# Patient Record
Sex: Female | Born: 1955 | Race: White | Hispanic: No | Marital: Married | Smoking: Never smoker
Health system: Southern US, Community
[De-identification: ages and names within clinical notes are randomized; demographics above are authoritative.]

## PROBLEM LIST (undated history)

## (undated) DIAGNOSIS — I10 Essential (primary) hypertension: Secondary | ICD-10-CM

## (undated) DIAGNOSIS — N814 Uterovaginal prolapse, unspecified: Secondary | ICD-10-CM

## (undated) DIAGNOSIS — K219 Gastro-esophageal reflux disease without esophagitis: Secondary | ICD-10-CM

## (undated) DIAGNOSIS — I825Z1 Chronic embolism and thrombosis of unspecified deep veins of right distal lower extremity: Principal | ICD-10-CM

## (undated) DIAGNOSIS — L509 Urticaria, unspecified: Secondary | ICD-10-CM

## (undated) DIAGNOSIS — F419 Anxiety disorder, unspecified: Secondary | ICD-10-CM

## (undated) DIAGNOSIS — F329 Major depressive disorder, single episode, unspecified: Secondary | ICD-10-CM

## (undated) DIAGNOSIS — D689 Coagulation defect, unspecified: Secondary | ICD-10-CM

## (undated) DIAGNOSIS — Z9889 Other specified postprocedural states: Secondary | ICD-10-CM

## (undated) DIAGNOSIS — D6851 Activated protein C resistance: Secondary | ICD-10-CM

## (undated) DIAGNOSIS — F32A Depression, unspecified: Secondary | ICD-10-CM

## (undated) DIAGNOSIS — R112 Nausea with vomiting, unspecified: Secondary | ICD-10-CM

## (undated) HISTORY — DX: Other specified postprocedural states: Z98.890

## (undated) HISTORY — DX: Depression, unspecified: F32.A

## (undated) HISTORY — DX: Essential (primary) hypertension: I10

## (undated) HISTORY — DX: Urticaria, unspecified: L50.9

## (undated) HISTORY — PX: OTHER SURGICAL HISTORY: SHX169

## (undated) HISTORY — DX: Chronic embolism and thrombosis of unspecified deep veins of right distal lower extremity: I82.5Z1

## (undated) HISTORY — DX: Major depressive disorder, single episode, unspecified: F32.9

## (undated) HISTORY — DX: Gastro-esophageal reflux disease without esophagitis: K21.9

## (undated) HISTORY — PX: HERNIA REPAIR: SHX51

## (undated) HISTORY — PX: VAGINAL HYSTERECTOMY: SUR661

## (undated) HISTORY — DX: Nausea with vomiting, unspecified: R11.2

## (undated) HISTORY — DX: Anxiety disorder, unspecified: F41.9

## (undated) HISTORY — DX: Uterovaginal prolapse, unspecified: N81.4

## (undated) HISTORY — DX: Activated protein C resistance: D68.51

## (undated) HISTORY — DX: Coagulation defect, unspecified: D68.9

## (undated) HISTORY — PX: COLONOSCOPY: SHX174

---

## 1999-03-27 ENCOUNTER — Other Ambulatory Visit: Admission: RE | Admit: 1999-03-27 | Discharge: 1999-03-27 | Payer: Self-pay | Admitting: *Deleted

## 2000-12-29 ENCOUNTER — Other Ambulatory Visit: Admission: RE | Admit: 2000-12-29 | Discharge: 2000-12-29 | Payer: Self-pay | Admitting: *Deleted

## 2002-04-19 ENCOUNTER — Other Ambulatory Visit: Admission: RE | Admit: 2002-04-19 | Discharge: 2002-04-19 | Payer: Self-pay | Admitting: *Deleted

## 2003-08-13 ENCOUNTER — Other Ambulatory Visit: Admission: RE | Admit: 2003-08-13 | Discharge: 2003-08-13 | Payer: Self-pay | Admitting: *Deleted

## 2004-10-21 ENCOUNTER — Other Ambulatory Visit: Admission: RE | Admit: 2004-10-21 | Discharge: 2004-10-21 | Payer: Self-pay | Admitting: *Deleted

## 2005-11-16 ENCOUNTER — Other Ambulatory Visit: Admission: RE | Admit: 2005-11-16 | Discharge: 2005-11-16 | Payer: Self-pay | Admitting: *Deleted

## 2006-10-26 ENCOUNTER — Other Ambulatory Visit: Admission: RE | Admit: 2006-10-26 | Discharge: 2006-10-26 | Payer: Self-pay | Admitting: *Deleted

## 2011-09-22 ENCOUNTER — Other Ambulatory Visit: Payer: Self-pay | Admitting: Gynecology

## 2011-09-22 DIAGNOSIS — R928 Other abnormal and inconclusive findings on diagnostic imaging of breast: Secondary | ICD-10-CM

## 2011-10-04 ENCOUNTER — Other Ambulatory Visit: Payer: Self-pay

## 2011-10-04 ENCOUNTER — Ambulatory Visit
Admission: RE | Admit: 2011-10-04 | Discharge: 2011-10-04 | Disposition: A | Discharge status: Home / Self Care | Payer: BC Managed Care – PPO | Source: Ambulatory Visit | Attending: Gynecology | Admitting: Gynecology

## 2011-10-04 ENCOUNTER — Other Ambulatory Visit: Payer: Self-pay | Admitting: Gynecology

## 2011-10-04 DIAGNOSIS — R928 Other abnormal and inconclusive findings on diagnostic imaging of breast: Secondary | ICD-10-CM

## 2012-09-19 ENCOUNTER — Other Ambulatory Visit: Payer: Self-pay | Admitting: Internal Medicine

## 2012-09-19 ENCOUNTER — Ambulatory Visit
Admission: RE | Admit: 2012-09-19 | Discharge: 2012-09-19 | Disposition: A | Discharge status: Home / Self Care | Payer: BC Managed Care – PPO | Source: Ambulatory Visit | Attending: Internal Medicine | Admitting: Internal Medicine

## 2012-09-19 DIAGNOSIS — M79661 Pain in right lower leg: Secondary | ICD-10-CM

## 2012-12-08 ENCOUNTER — Encounter (INDEPENDENT_AMBULATORY_CARE_PROVIDER_SITE_OTHER): Payer: BC Managed Care – PPO

## 2012-12-08 DIAGNOSIS — M79609 Pain in unspecified limb: Secondary | ICD-10-CM

## 2012-12-12 ENCOUNTER — Telehealth: Payer: Self-pay | Admitting: Cardiology

## 2012-12-12 NOTE — Telephone Encounter (Signed)
Lower Venous Report Faxed to Dr. Eulah Pont

## 2014-06-03 IMAGING — US US EXTREM LOW VENOUS*R*
1 series · 14 of 24 positions shown · non-contrast
Comparison: None.

CLINICAL DATA: Right calf pain and swelling.

RIGHT LOWER EXTREMITY VENOUS DUPLEX ULTRASOUND
TECHNIQUE: Gray-scale sonography with graded compression, as well
as color Doppler and duplex ultrasound, were performed to evaluate
the deep venous system of the lower extremity from the level of the
common femoral vein through the popliteal and proximal calf veins.
Spectral Doppler was utilized to evaluate flow at rest and with
distal augmentation maneuvers.

[Series 1: us extrem low venous*right* · 14 of 31 slices shown]
[im 1/31]
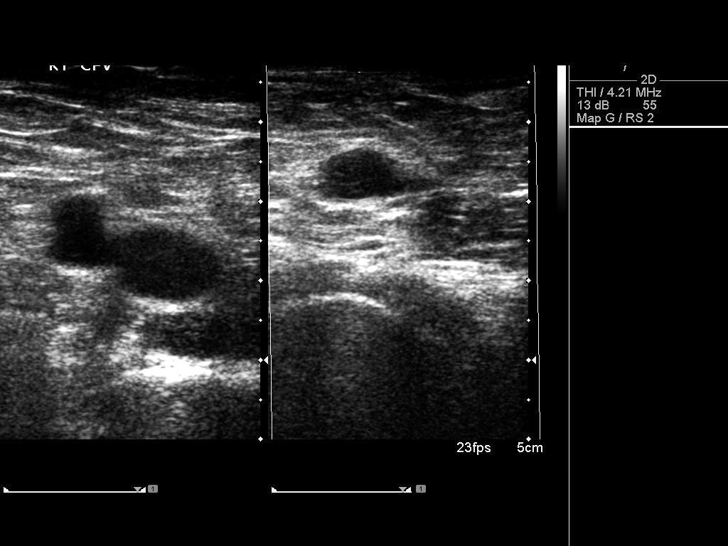
[im 3/31]
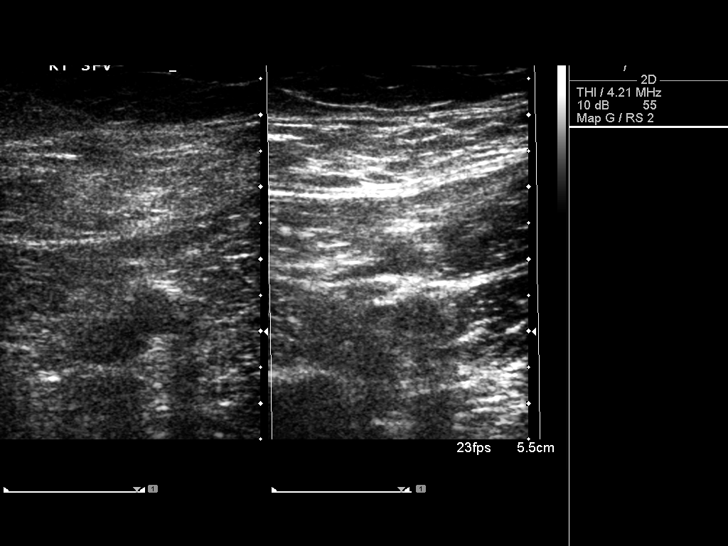
[im 6/31]
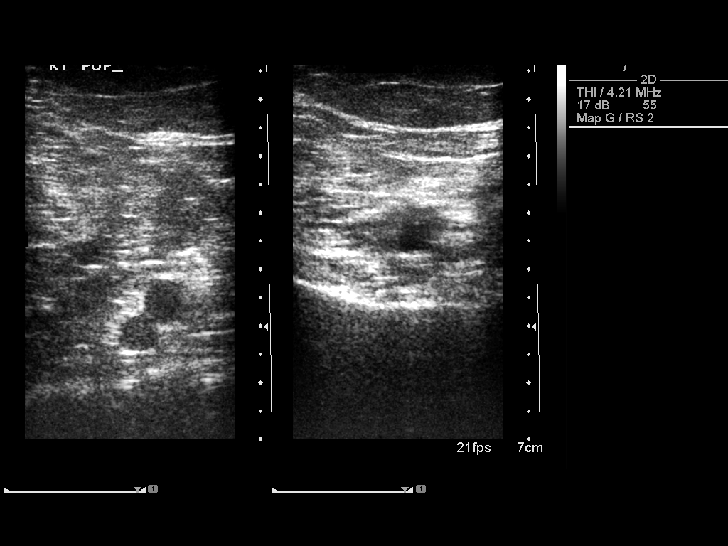
[im 8/31]
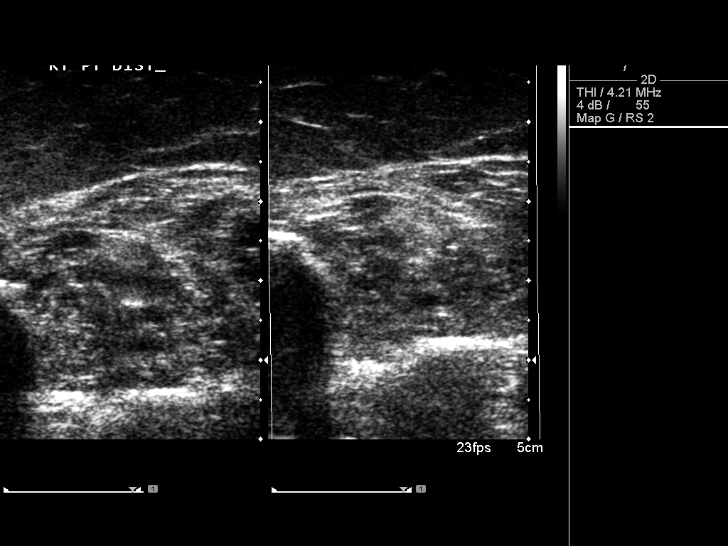
[im 10/31]
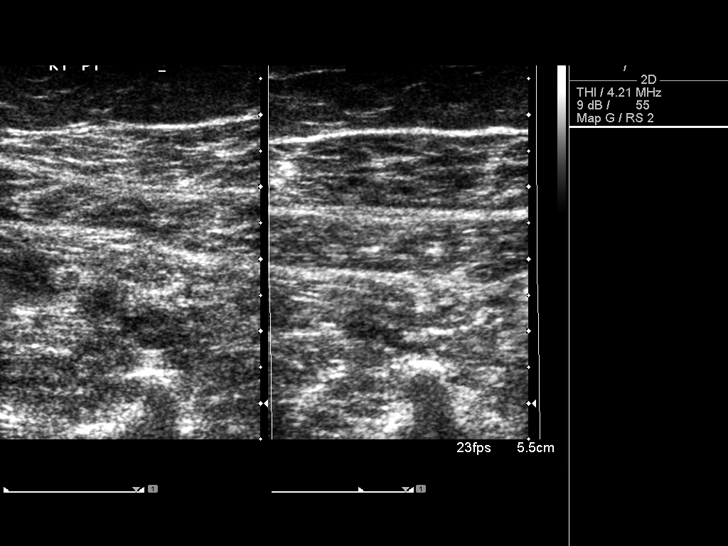
[im 12/31]
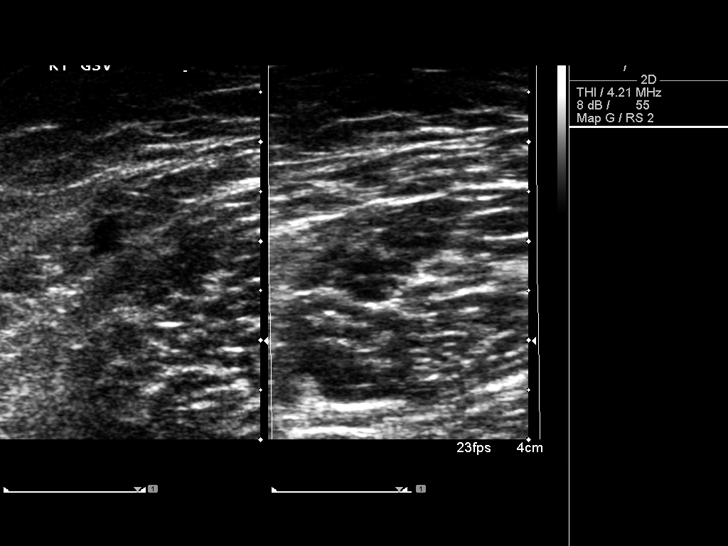
[im 15/31]
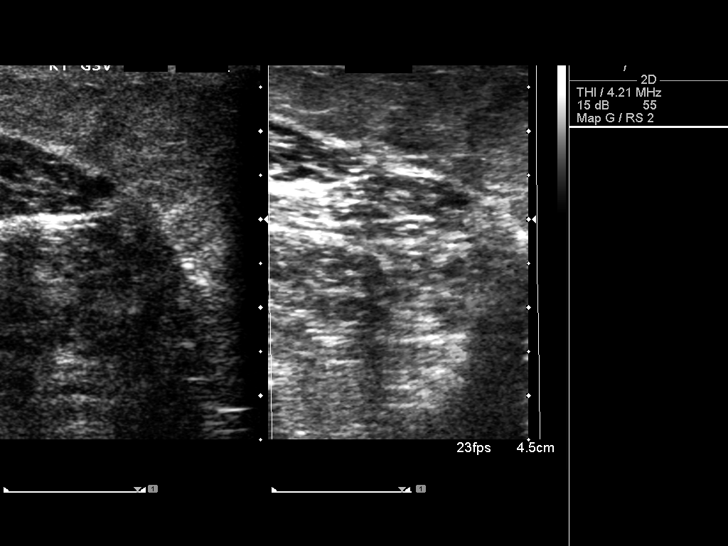
[im 16/31]
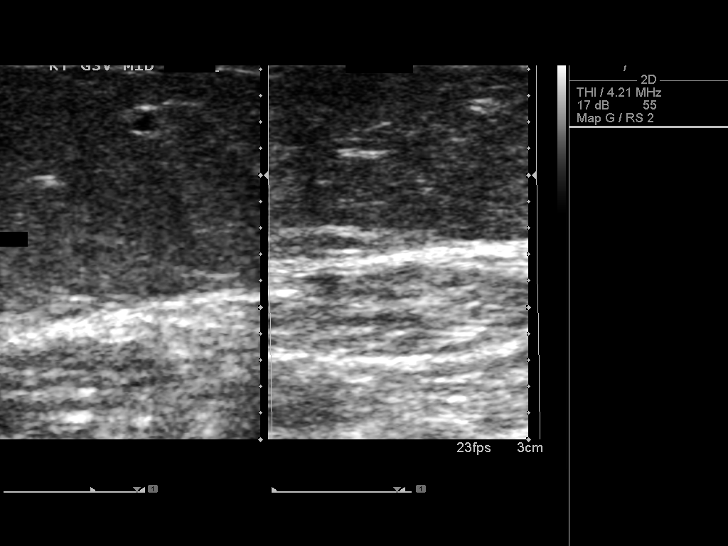
[im 19/31]
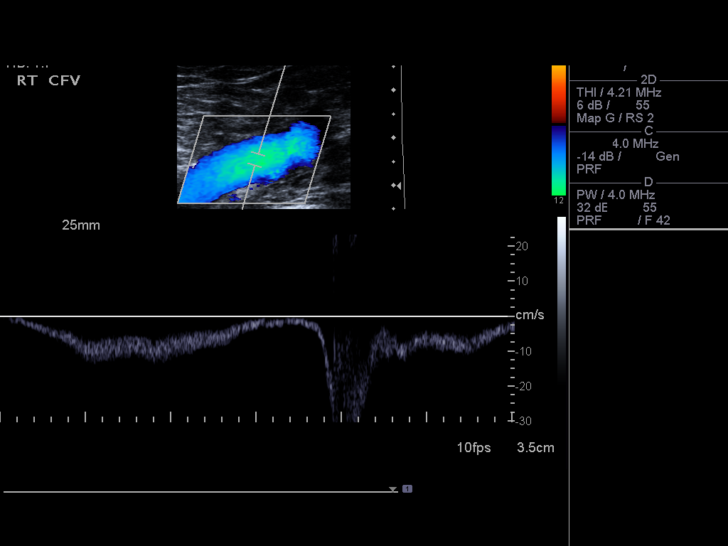
[im 21/31]
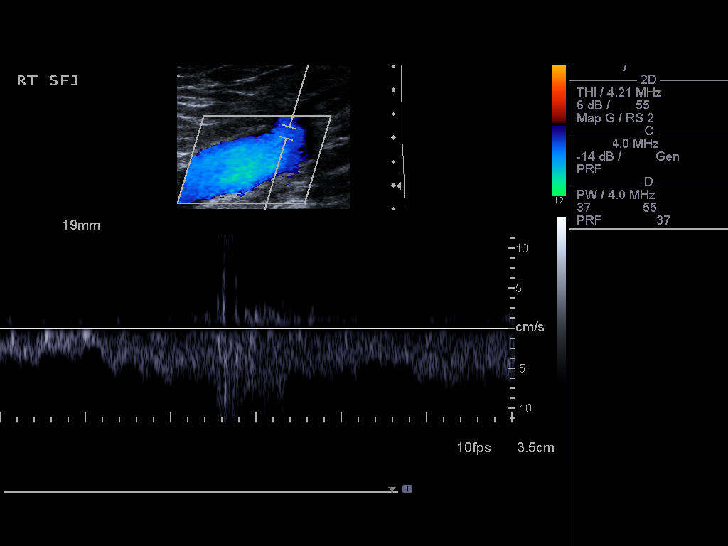
[im 24/31]
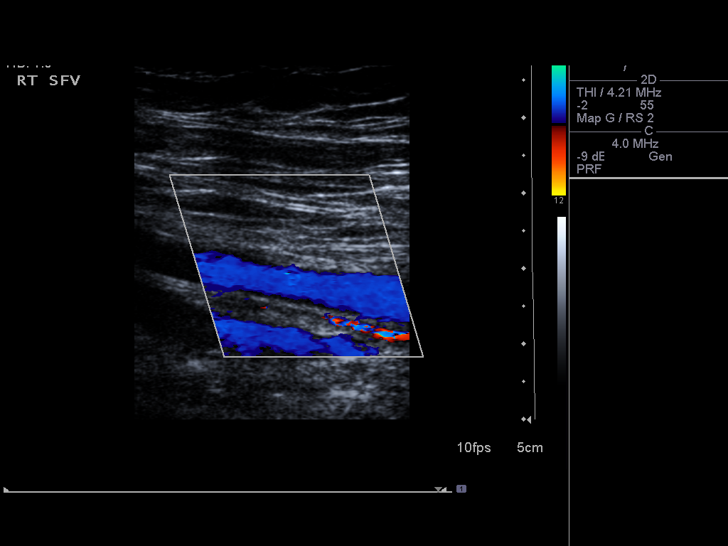
[im 25/31]
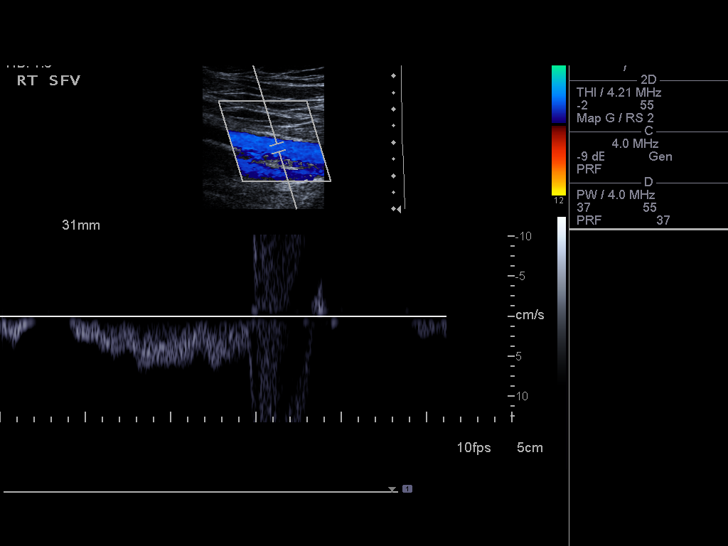
[im 28/31]
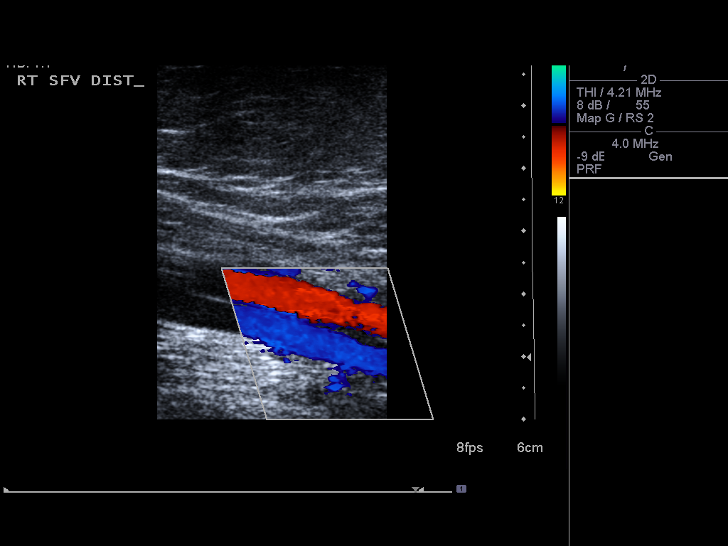
[im 31/31]
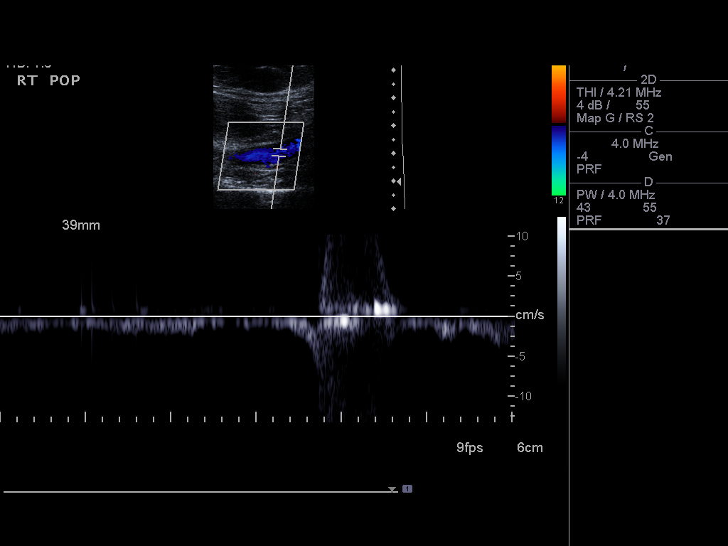

[14 of 24 positions shown; findings below may reference images not displayed]

FINDINGS: From the level of the right common femoral vein to the
right popliteal vein, there is adequate color flow, compression and
augmentation without evidence of a right lower extremity deep
venous thrombosis.

Evaluation of the right calf veins is limited.

Interrogated aspects of the right greater saphenous vein appear
patent.
IMPRESSION: No evidence of right lower extremity deep venous thrombosis as
discussed above.

## 2015-12-18 ENCOUNTER — Ambulatory Visit (INDEPENDENT_AMBULATORY_CARE_PROVIDER_SITE_OTHER): Payer: 59 | Admitting: Sports Medicine

## 2015-12-18 ENCOUNTER — Ambulatory Visit: Payer: 59

## 2015-12-18 ENCOUNTER — Encounter: Payer: Self-pay | Admitting: Sports Medicine

## 2015-12-18 DIAGNOSIS — S92912A Unspecified fracture of left toe(s), initial encounter for closed fracture: Secondary | ICD-10-CM

## 2015-12-18 DIAGNOSIS — M79672 Pain in left foot: Secondary | ICD-10-CM

## 2015-12-18 NOTE — Progress Notes (Signed)
Patient ID: Laura HensenAngela Le, female   DOB: Jul 04, 1956, 60 y.o.   MRN: [id number] Subjective: Laura Le is a 60 y.o. female patient who presents to office for evaluation of Left foot pain. Patient complains of pain especially over the last 2 weeks after jamming her foot. Patient has tried splinting with no relief in symptoms. Patient denies any other pedal complaints.   There are no active problems to display for this patient.   No current outpatient prescriptions on file prior to visit.   No current facility-administered medications on file prior to visit.    Not on File  Objective:  General: Alert and oriented x3 in no acute distress  Dermatology: No open lesions bilateral lower extremities, no webspace macerations, no ecchymosis bilateral, all nails x 10 are well manicured.  Vascular: Focal edema noted to left 4th toe. Dorsalis Pedis and Posterior Tibial pedal pulses 2/4, Capillary Fill Time 3 seconds,(+) pedal hair growth bilateral, Temperature gradient within normal limits.  Neurology: Gross sensation intact via light touch bilateral, (- )Tinels sign left foot.   Musculoskeletal: There is tenderness with palpation at 4th toe on Left foot, ,No pain with calf compression bilateral. All joint range of motion is within normal limits except at left 4th toe where there is guarding, Strength within normal limits in all groups bilateral.   Xrays  Left Foot   Impression:Normal osseous mineralization. There is fracture, proximal phalanx, fourth toe that is minimally displaced extra-articular non-comminuted with a small step-off portion. No dislocation, mild soft tissue swelling focal to fourth toe, mild lesser hammertoe deformity. Inferior calcaneal spur. Mild midfoot and ankle arthritic changes. No other acute findings.    Assessment and Plan: Problem List Items Addressed This Visit    None    Visit Diagnoses    Left foot pain    -  Primary    Relevant Orders    DG Foot Complete Left    Toe fracture, left, closed, initial encounter        4th toe proximal phalanx       -Complete examination performed -Xrays reviewed -Discussed treatement options for fracture; risks, alternatives, and benefits explained. -Applied Toe splint and instructed patient to do same daily -Dispensed post op shoe to patient to wear at all times and instructed on use -Recommend protection, rest, ice, elevation daily until symptoms improve -Recommend Tylenol as needed for pain -Patient to return to office in 4 weeks for serial x-rays to assess healing  or sooner if condition worsens.  Asencion Islamitorya Esgar Barnick, DPM

## 2015-12-18 NOTE — Patient Instructions (Signed)
Toe Fracture °A toe fracture is a break in one of the toe bones (phalanges). °CAUSES °This condition may be caused by: °· Dropping a heavy object on your toe. °· Stubbing your toe. °· Overusing your toe or doing repetitive exercise. °· Twisting or stretching your toe out of place. °RISK FACTORS °This condition is more likely to develop in people who: °· Play contact sports. °· Have a bone disease. °· Have a low calcium level. °SYMPTOMS °The main symptoms of this condition are swelling and pain in the toe. The pain may get worse with standing or walking. Other symptoms include: °· Bruising. °· Stiffness. °· Numbness. °· A change in the way the toe looks. °· Broken bones that poke through the skin. °· Blood beneath the toenail. °DIAGNOSIS °This condition is diagnosed with a physical exam. You may also have X-rays. °TREATMENT  °Treatment for this condition depends on the type of fracture and its severity. Treatment may involve: °· Taping the broken toe to a toe that is next to it (buddy taping). This is the most common treatment for fractures in which the bone has not moved out of place (nondisplaced fracture). °· Wearing a shoe that has a wide, rigid sole to protect the toe and to limit its movement. °· Wearing a walking cast. °· Having a procedure to move the toe back into place. °· Surgery. This may be needed: °¨ If there are many pieces of broken bone that are out of place (displaced). °¨ If the toe joint breaks. °¨ If the bone breaks through the skin. °· Physical therapy. This is done to help regain movement and strength in the toe. °You may need follow-up X-rays to make sure that the bone is healing well and staying in position. °HOME CARE INSTRUCTIONS °If You Have a Cast: °· Do not stick anything inside the cast to scratch your skin. Doing that increases your risk of infection. °· Check the skin around the cast every day. Report any concerns to your health care provider. You may put lotion on dry skin around the  edges of the cast. Do not apply lotion to the skin underneath the cast. °· Do not put pressure on any part of the cast until it is fully hardened. This may take several hours. °· Keep the cast clean and dry. °Bathing °· Do not take baths, swim, or use a hot tub until your health care provider approves. Ask your health care provider if you can take showers. You may only be allowed to take sponge baths for bathing. °· If your health care provider approves bathing and showering, cover the cast or bandage (dressing) with a watertight plastic bag to protect it from water. Do not let the cast or dressing get wet. °Managing Pain, Stiffness, and Swelling °· If you do not have a cast, apply ice to the injured area, if directed. °¨ Put ice in a plastic bag. °¨ Place a towel between your skin and the bag. °¨ Leave the ice on for 20 minutes, 2-3 times per day. °· Move your toes often to avoid stiffness and to lessen swelling. °· Raise (elevate) the injured area above the level of your heart while you are sitting or lying down. °Driving °· Do not drive or operate heavy machinery while taking pain medicine. °· Do not drive while wearing a cast on a foot that you use for driving. °Activity °· Return to your normal activities as directed by your health care provider. Ask your health care   provider what activities are safe for you. °· Perform exercises daily as directed by your health care provider or physical therapist. °Safety °· Do not use the injured limb to support your body weight until your health care provider says that you can. Use crutches or other assistive devices as directed by your health care provider. °General Instructions °· If your toe was treated with buddy taping, follow your health care provider's instructions for changing the gauze and tape. Change it more often: °¨ The gauze and tape get wet. If this happens, dry the space between the toes. °¨ The gauze and tape are too tight and cause your toe to become pale  or numb. °· Wear a protective shoe as directed by your health care provider. If you were not given a protective shoe, wear sturdy, supportive shoes. Your shoes should not pinch your toes and should not fit tightly against your toes. °· Do not use any tobacco products, including cigarettes, chewing tobacco, or e-cigarettes. Tobacco can delay bone healing. If you need help quitting, ask your health care provider. °· Take medicines only as directed by your health care provider. °· Keep all follow-up visits as directed by your health care provider. This is important. °SEEK MEDICAL CARE IF: °· You have a fever. °· Your pain medicine is not helping. °· Your toe is cold. °· Your toe is numb. °· You still have pain after one week of rest and treatment. °· You still have pain after your health care provider has said that you can start walking again. °· You have pain, tingling, or numbness in your foot that is not going away. °SEEK IMMEDIATE MEDICAL CARE IF: °· You have severe pain. °· You have redness or inflammation in your toe that is getting worse. °· You have pain or numbness in your toe that is getting worse. °· Your toe turns blue. °  °This information is not intended to replace advice given to you by your health care provider. Make sure you discuss any questions you have with your health care provider. °  °Document Released: 07/23/2000 Document Revised: 04/16/2015 Document Reviewed: 05/22/2014 °Elsevier Interactive Patient Education ©2016 [employer] ° °

## 2016-01-15 ENCOUNTER — Encounter: Payer: Self-pay | Admitting: Sports Medicine

## 2016-01-15 ENCOUNTER — Ambulatory Visit (INDEPENDENT_AMBULATORY_CARE_PROVIDER_SITE_OTHER): Payer: 59

## 2016-01-15 ENCOUNTER — Ambulatory Visit (INDEPENDENT_AMBULATORY_CARE_PROVIDER_SITE_OTHER): Payer: 59 | Admitting: Sports Medicine

## 2016-01-15 DIAGNOSIS — M79672 Pain in left foot: Secondary | ICD-10-CM

## 2016-01-15 DIAGNOSIS — S92912D Unspecified fracture of left toe(s), subsequent encounter for fracture with routine healing: Secondary | ICD-10-CM | POA: Diagnosis not present

## 2016-01-15 NOTE — Progress Notes (Signed)
Patient ID: Laura HensenAngela Le, female   DOB: [birth year], 60 y.o.   MRN: [id number]   Subjective: Laura Hensenngela Le is a 60 y.o. female patient who returns to office for follow up evaluation of Left foot pain secondary to fracture. Patient states that pain is better; has been wearing post op shoe and coban splint with improvement. Patient denies any other pedal complaints.   Patient Active Problem List   Diagnosis Date Noted  . 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 buPROPion (WELLBUTRIN XL) 150 MG 24 hr tablet TAKE 1 TABLET BY MOUTH DAILY--NEEDS APPT FOR MORE REFILLS  0  . ELIQUIS 5 MG TABS tablet Take 5 mg by mouth 2 (two) times daily.  5  . valsartan (DIOVAN) 80 MG tablet Take 80 mg by mouth daily.  4   No current facility-administered medications on file prior to visit.    No Known Allergies  Objective:  General: Alert and oriented x3 in no acute distress  Dermatology: No open lesions bilateral lower extremities, no webspace macerations, no ecchymosis bilateral, all nails x 10 are well manicured.  Vascular: Decreased edema noted to left 4th toe. Dorsalis Pedis and Posterior Tibial pedal pulses 2/4, Capillary Fill Time 3 seconds,(+) pedal hair growth bilateral, Temperature gradient within normal limits.  Neurology: Gross sensation intact via light touch bilateral, (- )Tinels sign left foot.   Musculoskeletal: There is no tenderness with palpation at 4th toe on Left foot, ,No pain with calf compression bilateral. All joint range of motion is within normal limits except at left 4th toe where there is mild guarding, Strength within normal limits in all groups bilateral.   Xrays  Left Foot   Impression:Normal osseous mineralization. There is fracture, proximal phalanx, fourth toe that is minimally displaced extra-articular non-comminuted with a small step-off portion, improving in nature, 70%. No dislocation,  mild soft tissue swelling focal to fourth toe, mild lesser hammertoe deformity. Inferior calcaneal spur. Mild midfoot and ankle arthritic changes. No other acute findings.    Assessment and Plan: Problem List Items Addressed This Visit    None    Visit Diagnoses    Left foot pain    -  Primary    Relevant Orders    DG Foot Complete Left    Toe fracture, left, with routine healing, subsequent encounter        4th toe       -Complete examination performed -Xrays reviewed -Discussed treatement options for fracture; risks, alternatives, and benefits explained. -Continue with Toe splint -Continue with post op shoe to patient to wear at all times and instructed on use -Recommend protection, rest, ice, elevation daily until symptoms improve -Recommend Tylenol as needed for pain -Patient to return to office in 4 weeks for serial x-rays to assess healing  or sooner if condition worsens.  Asencion Islamitorya Breion Novacek, DPM

## 2016-02-18 ENCOUNTER — Ambulatory Visit: Payer: 59 | Admitting: Sports Medicine

## 2016-09-30 HISTORY — PX: ESOPHAGOGASTRODUODENOSCOPY: SHX1529

## 2016-09-30 HISTORY — PX: COLONOSCOPY: SHX5424

## 2017-11-07 ENCOUNTER — Other Ambulatory Visit: Payer: Self-pay

## 2017-11-07 ENCOUNTER — Encounter: Payer: Self-pay | Admitting: Oncology

## 2017-11-07 ENCOUNTER — Encounter (INDEPENDENT_AMBULATORY_CARE_PROVIDER_SITE_OTHER): Payer: Self-pay

## 2017-11-07 ENCOUNTER — Ambulatory Visit: Payer: 59 | Admitting: Oncology

## 2017-11-07 DIAGNOSIS — D6851 Activated protein C resistance: Secondary | ICD-10-CM | POA: Insufficient documentation

## 2017-11-07 DIAGNOSIS — Z90722 Acquired absence of ovaries, bilateral: Secondary | ICD-10-CM

## 2017-11-07 DIAGNOSIS — I10 Essential (primary) hypertension: Secondary | ICD-10-CM | POA: Diagnosis not present

## 2017-11-07 DIAGNOSIS — Z6832 Body mass index (BMI) 32.0-32.9, adult: Secondary | ICD-10-CM | POA: Diagnosis not present

## 2017-11-07 DIAGNOSIS — Z7901 Long term (current) use of anticoagulants: Secondary | ICD-10-CM | POA: Diagnosis not present

## 2017-11-07 DIAGNOSIS — Z86718 Personal history of other venous thrombosis and embolism: Secondary | ICD-10-CM | POA: Diagnosis not present

## 2017-11-07 DIAGNOSIS — E663 Overweight: Secondary | ICD-10-CM

## 2017-11-07 DIAGNOSIS — Z9889 Other specified postprocedural states: Secondary | ICD-10-CM | POA: Diagnosis not present

## 2017-11-07 DIAGNOSIS — K219 Gastro-esophageal reflux disease without esophagitis: Secondary | ICD-10-CM

## 2017-11-07 DIAGNOSIS — I825Z1 Chronic embolism and thrombosis of unspecified deep veins of right distal lower extremity: Secondary | ICD-10-CM | POA: Insufficient documentation

## 2017-11-07 DIAGNOSIS — Z90710 Acquired absence of both cervix and uterus: Secondary | ICD-10-CM

## 2017-11-07 HISTORY — DX: Gastro-esophageal reflux disease without esophagitis: K21.9

## 2017-11-07 HISTORY — DX: Essential (primary) hypertension: I10

## 2017-11-07 HISTORY — DX: Chronic embolism and thrombosis of unspecified deep veins of right distal lower extremity: I82.5Z1

## 2017-11-07 HISTORY — DX: Activated protein C resistance: D68.51

## 2017-11-07 NOTE — Patient Instructions (Signed)
OK to stop Eliquis Return to Hematologist only as needed

## 2017-11-07 NOTE — Progress Notes (Signed)
New Patient Hematology   Laura Le [id number] 1956/05/14 62 y.o. 11/07/2017  CC: Dr. Johnella Le; Dr. Jonna Le   Reason for referral: Advice on duration of anticoagulation   HPI:  62 year old woman in overall excellent health who sustained a right lower extremity DVT about 5 years ago at age 30.  She was on Premarin at that time and had been on the Premarin for many years.  It was discontinued when she went back on it she had the DVT.  She was found to be a heterozygote for the factor V Leiden gene mutation at that time.  Right around the same time, she lost her mother at age 84 with what sounds like bilateral femoral artery emboli resulting in a below the knee amputation on one side and subsequent death due to complications.  Mother had a history of arrhythmia and had a pacemaker.  Her father is still alive at age 21.  She has a sister age 40, brothers aged 48 and 24, 2 sons age 8 and 61, and no one else in the family has had problems with blood clots. She is overweight.  She is a never smoker.  No hyperlipidemia.  Only active medical problem is treated hypertension. She has had multiple surgical procedures including femoral hernia when she was in the eighth grade, hysterectomy with oophorectomy in her early 38s, bilateral vein procedure on her legs for "spider veins", right breast biopsy, right ear surgery, and she never had any clotting events around any of these procedures. She has no signs or symptoms of a collagen vascular disorder. She gets occasional discomfort in the right calf when she is on her feet for any length of time.  She denied any chest pain, dyspnea, or palpitations.  PMH: Past Medical History:  Diagnosis Date  . Factor 5 Leiden mutation, heterozygous (HCC) 11/07/2017   Dx at time of RLE DVT approx 2014  . GERD (gastroesophageal reflux disease) 11/07/2017  . HTN (hypertension), benign 11/07/2017  . Lower leg DVT (deep venous thromboembolism), chronic, right (HCC)  11/07/2017   Occurred while on premarin approx 2014  No history of MI, ulcers, diabetes, TB, hepatitis, yellow jaundice, mononucleosis, thyroid disease, seizure or stroke, or inflammatory arthritis. Surgeries as noted above.   Allergies: No Known Allergies  Medications:  Current Outpatient Medications:  .  buPROPion (WELLBUTRIN XL) 150 MG 24 hr tablet, TAKE 1 TABLET BY MOUTH DAILY--NEEDS APPT FOR MORE REFILLS, Disp: , Rfl: 0 .  ELIQUIS 5 MG TABS tablet, Take 5 mg by mouth 2 (two) times daily., Disp: , Rfl: 5 .  Glucosamine Sulfate 500 MG TABS, Take by mouth., Disp: , Rfl:  .  magnesium 30 MG tablet, Take 30 mg by mouth., Disp: , Rfl:  .  Urea 40 % LOTN, , Disp: , Rfl:  .  valsartan (DIOVAN) 80 MG tablet, Take 80 mg by mouth daily., Disp: , Rfl: 4  Social History: She works as an Psychologist, educational.  She does travel frequently but usually not for long distances.  She does wear an elastic stocking when she travels.  2 healthy sons.  she has never smoked.  She drinks minimal alcohol. she does not use drugs.  Family History: See HPI  Review of Systems: See HPI Remaining ROS negative.  Physical Exam: Blood pressure (!) 157/89, pulse 84, temperature 98 F (36.7 C), height 5\' 1"  (1.549 m), weight 174 lb 8 oz (79.2 kg), SpO2 100 %. Wt Readings from Last 3 Encounters:  11/07/17 174  lb 8 oz (79.2 kg)     General appearance: Well-nourished Caucasian woman Laura Le: Pharynx no erythema, exudate, mass, or ulcer. No thyromegaly or thyroid nodules Lymph nodes: No cervical, supraclavicular, or axillary lymphadenopathy Breasts: Lungs: Clear to auscultation, resonant to percussion throughout Heart: Regular rhythm, no murmur, no gallop, no rub, no click, no edema Abdomen: Soft, nontender, normal bowel sounds, no mass, no organomegaly Extremities: No edema, no calf tenderness Musculoskeletal: no joint deformities GU:  Vascular: Carotid pulses 2+, no bruits, distal pulses: Dorsalis pedis 1+  symmetric Neurologic: Alert, oriented, PERRLA,  cranial nerves grossly normal, motor strength 5 over 5, reflexes 1+ symmetric at the biceps, absent symmetric at the knees, upper body coordination normal, gait normal, Skin: No rash or ecchymosis    Lab Results: No results found for: WBC, HGB, HCT, MCV, PLT   Chemistry   No results found for: NA, K, CL, CO2, BUN, CREATININE, GLU No results found for: CALCIUM, ALKPHOS, AST, ALT, BILITOT    Radiological Studies: No results found.    Impression: Remote history of a provoked right lower extremity DVT related to estrogen containing oral contraceptive plus findings of heterozygote status for the factor V Leiden gene mutation.  We have learned over the years that the heterozygote status for the factor V Leiden gene mutation is only a minor risk factor for thrombosis which usually only expresses itself in conjunction with another well-defined risk factor such as surgery or estrogen use.  We no longer recommend long-term anticoagulation for a provoked clot.  We no longer recommend checking family members unless there is a history of a thrombosis in a first-degree relative. I believe that her mother's event, although this frightened the patient since it occurred around the same time as her clot, was probably an arterial event in an older woman with cardiac disease and a known arrhythmia.  Her family history is otherwise negative for venous thrombosis.    Recommendation: I am recommending that she discontinue long-term anticoagulation. Use anticoagulation for brief intervals following any major surgery. Continue to wear an elastic stocking for any prolonged travel. We discussed that there is really no data to support using aspirin as a preventative therapy in a low risk situation since at best it only provides an approximate 30% reduction in recurrent venous thrombotic events.  She is contemplating some plastic surgery on her neck in the near  future.  This does not involve invasion of major blood vessels, or immobilization so I do not think that she would need prophylactic anticoagulation around that procedure.    Laura DarbyJames Laura Mcglone, MD, FACP  Hematology-Oncology/Internal Medicine  11/07/2017, 3:44 PM

## 2018-01-06 ENCOUNTER — Encounter: Payer: Self-pay | Admitting: Gastroenterology

## 2018-01-12 ENCOUNTER — Ambulatory Visit: Payer: 59 | Admitting: Gastroenterology

## 2018-02-07 ENCOUNTER — Encounter: Payer: Self-pay | Admitting: Gastroenterology

## 2018-02-14 ENCOUNTER — Encounter

## 2018-02-14 ENCOUNTER — Ambulatory Visit: Payer: 59 | Admitting: Gastroenterology

## 2018-11-28 ENCOUNTER — Ambulatory Visit: Payer: 59 | Admitting: Gastroenterology

## 2019-07-12 ENCOUNTER — Telehealth: Payer: Self-pay | Admitting: Gastroenterology

## 2019-07-12 ENCOUNTER — Encounter: Payer: Self-pay | Admitting: Gastroenterology

## 2019-07-12 DIAGNOSIS — K219 Gastro-esophageal reflux disease without esophagitis: Secondary | ICD-10-CM

## 2019-07-12 NOTE — Telephone Encounter (Signed)
DOD- Dr. Lyndel Safe patient= Called and spoke with patient-patient reports she has used Nexium (did not work), Prilosec (did not help), husband's RX Protonix (helps with heartburn but not the reflux); patient reports she has also used OTC Tums; patient has been having worsening symptoms for the last two months; not been eating or drinking anything that would cause symptoms to worsen-gave up sodas, spicy foods, extra coffee; used Zantac in the past and it "helped to resolve the heartburn and reflux all together"-  Patient is requesting a RX for "anything that will help with the heartburn and the acid reflux being so bad";     Please advise as patient is going out of town tomorrow night and is looking for an answer as soon as possible

## 2019-07-13 NOTE — Telephone Encounter (Signed)
Please review and advise.

## 2019-07-13 NOTE — Telephone Encounter (Signed)
Sorry I did not see this late afternoon.  In any event, not urgent.  Dr. Lyndel Safe can address this this morning.  Thanks

## 2019-07-16 ENCOUNTER — Other Ambulatory Visit: Payer: Self-pay

## 2019-07-16 MED ORDER — FAMOTIDINE 20 MG PO TABS: 20.0000 mg | ORAL_TABLET | Freq: Every day | ORAL | 6 refills | Status: DC

## 2019-07-16 MED ORDER — PANTOPRAZOLE SODIUM 40 MG PO TBEC: 40.0000 mg | DELAYED_RELEASE_TABLET | Freq: Every day | ORAL | 6 refills | Status: DC

## 2019-07-16 NOTE — Telephone Encounter (Signed)
Called and spoke with patient-patient informed of MD recommendations; patient is agreeable with plan of care and verified pharmacy; RX sent to pharmacy; Patient verbalized understanding of information/instructions;  Patient was advised to call the office at [phone] if questions/concerns arise; 

## 2019-07-16 NOTE — Telephone Encounter (Signed)
Lets do Protonix 40 mg p.o. every morning, 30, 6 refills Add Pepcid 20 mg p.o. nightly (the took Zantac off the market, if she has any at hand, she certainly can use that), 30, 6 refills Let us know how she is in 8 to 12 weeks  Thx  RG

## 2019-08-07 ENCOUNTER — Ambulatory Visit: Payer: Self-pay | Admitting: Gastroenterology

## 2019-08-27 ENCOUNTER — Telehealth: Payer: Self-pay | Admitting: Gastroenterology

## 2019-08-27 NOTE — Telephone Encounter (Signed)
Called and spoke with patient-patient has been tested for COVID (tested +) on Saturday-patient reports she has been drinking a lot of acidic fluids because she is trying different fluids to keep her throat from hurting-advised to cut back on acidic fluids as it will upset her acid reflux-advised patient to drink warm fluids, soups, use hard candy/throat lozenges;    Please advise-patient is wondering if the swallowing difficulty is just from COVID or does she need to be seen in the clinic sooner than 09/14/2019?

## 2019-08-27 NOTE — Telephone Encounter (Signed)
Likely due to covid Seen fairly frequently Can use mylenta PRN RG

## 2019-08-27 NOTE — Telephone Encounter (Signed)
Patient returned call to the office=spoke with patient-patient informed of MD recommendations; patient is agreeable with plan of care and will keep f/u appt with Dr. Chales Abrahams on 09/12/2019; Patient verbalized understanding of information/instructions;  Patient was advised to call the office at [phone] if questions/concerns arise;

## 2019-09-12 ENCOUNTER — Encounter: Payer: Self-pay | Admitting: Gastroenterology

## 2019-09-12 ENCOUNTER — Telehealth (INDEPENDENT_AMBULATORY_CARE_PROVIDER_SITE_OTHER): Payer: Self-pay | Admitting: Gastroenterology

## 2019-09-12 ENCOUNTER — Other Ambulatory Visit: Payer: Self-pay

## 2019-09-12 DIAGNOSIS — K219 Gastro-esophageal reflux disease without esophagitis: Secondary | ICD-10-CM

## 2019-09-12 MED ORDER — PANTOPRAZOLE SODIUM 40 MG PO TBEC: 40.0000 mg | DELAYED_RELEASE_TABLET | Freq: Two times a day (BID) | ORAL | 2 refills | Status: DC

## 2019-09-12 NOTE — Patient Instructions (Signed)
If you are age 64 or older, your body mass index should be between 23-30. Your There is no height or weight on file to calculate BMI. If this is out of the aforementioned range listed, please consider follow up with your Primary Care Provider.  If you are age 59 or younger, your body mass index should be between 19-25. Your There is no height or weight on file to calculate BMI. If this is out of the aformentioned range listed, please consider follow up with your Primary Care Provider.   We have sent the following medications to your pharmacy for you to pick up at your convenience: Protonix   Continue Pepcid at bedtime.   Please call Dr. Donne Hazel nurse Veronia Beets, RN)  in 2 weeks at [phone]  to let her now how you are doing.    Follow up in 6-8 weeks.   Thank you,  Dr. Lynann Bologna

## 2019-09-12 NOTE — Addendum Note (Signed)
Addended by: Junius Roads on: 09/12/2019 10:27 AM   Modules accepted: Orders

## 2019-09-12 NOTE — Progress Notes (Signed)
Chief Complaint: FU reflux.  Referring Provider:  Raina Mina., MD      ASSESSMENT AND PLAN;   #1.  GERD with small HH  #2.  Recent COVID-19, currently on steroids  Plan: -For now increase Protonix 40 mg p.o. BID (60, 2 refills) -Continue Pepcid 20 mg p.o. nightly -Call us in 2 weeks. -If not better, add GI cocktail -FU in 6-8 weeks in person.   HPI:    Laura Le is a 64 y.o. female  With reflux-gotten worse ever since she had COVID-19 (2 weeks ago), currently on tapering steroids. She does feel better from respiratory standpoint. Had N/V/mild pneumonia.   Had significant heartburn-woke her up this morning at 530 as well.  She will be off by the weekend.  Currently no nausea/vomiting/odynophagia/dysphagia.  She denies having any significant diarrhea or constipation.  No shortness of breath.  She feels better overall except for reflux.  Past GI procedures: -EGD 09/2016: 2 cm HH, mild gastritis. CLO neg -Colonoscopy 09/2016: mod sig div, otherwise normal.  Repeat in 10 years.  Earlier, if with any new problems. -[place] 02/2014: Mild hepatic steatosis.  Firefighter. Past Medical History:  Diagnosis Date  . Depression   . Factor 5 Leiden mutation, heterozygous ([place]) 11/07/2017   Dx at time of RLE DVT approx 2014  . GERD (gastroesophageal reflux disease) 11/07/2017  . HTN (hypertension), benign 11/07/2017  . Lower leg DVT (deep venous thromboembolism), chronic, right ([place]) 11/07/2017   Occurred while on premarin approx 2014  . Uterine prolapse     Past Surgical History:  Procedure Laterality Date  . COLONOSCOPY  09/30/2016   Moderate predominantl sigmoid diverticulosis. Otherwise, normal colonoscopy.  . ESOPHAGOGASTRODUODENOSCOPY  09/30/2016   Small hiatal hernia. Minimal gastritis.  Marland Kitchen VAGINAL HYSTERECTOMY      Family History  Problem Relation Age of Onset  . Bladder Cancer Father   . Parkinson's disease Father   . Heart disease Mother   . Colon cancer  Neg Hx     Social History   Tobacco Use  . Smoking status: Never Smoker  . Smokeless tobacco: Never Used  Substance Use Topics  . Alcohol use: Yes    Alcohol/week: 0.0 standard drinks  . Drug use: Never    Current Outpatient Medications  Medication Sig Dispense Refill  . Ascorbic Acid (VITAMIN C PO) Take by mouth.    Marland Kitchen buPROPion (WELLBUTRIN XL) 150 MG 24 hr tablet TAKE 1 TABLET BY MOUTH DAILY--NEEDS APPT FOR MORE REFILLS  0  . Cholecalciferol (VITAMIN D3 PO) Take by mouth.    . famotidine (PEPCID) 20 MG tablet Take 1 tablet (20 mg total) by mouth at bedtime. (Patient taking differently: Take 20 mg by mouth as needed. ) 30 tablet 6  . Glucosamine Sulfate 500 MG TABS Take by mouth.    . losartan (COZAAR) 25 MG tablet 1 tablet daily.    . magnesium 30 MG tablet Take 30 mg by mouth.    . pantoprazole (PROTONIX) 40 MG tablet Take 1 tablet (40 mg total) by mouth daily. 30 tablet 6  . Probiotic Product (ALIGN) 4 MG CAPS Take by mouth.     No current facility-administered medications for this visit.    No Known Allergies  Review of Systems:  Constitutional: Denies fever, chills, diaphoresis, appetite change and fatigue.  HEENT: neg  Respiratory: Denies SOB, DOE, cough, chest tightness,  and wheezing.   Cardiovascular: Denies chest pain, palpitations and leg swelling.  Genitourinary: Denies  dysuria, urgency, frequency, hematuria, flank pain and difficulty urinating.  Musculoskeletal: Denies myalgias, back pain, joint swelling, arthralgias and gait problem.  Skin: No rash.  Neurological: Denies dizziness, seizures, syncope, weakness, light-headedness, numbness and headaches.  Hematological: Denies adenopathy. Easy bruising, personal or family bleeding history  Psychiatric/Behavioral: No anxiety or depression     Physical Exam:    LMP  (LMP Unknown)  Wt Readings from Last 3 Encounters:  11/07/17 174 lb 8 oz (79.2 kg)   Patient requested televisit as she was traveling  I  connected with  [name] on 09/12/19 by a video enabled telemedicine application and verified that I am speaking with the correct person using two identifiers.   I discussed the limitations of evaluation and management by telemedicine. The patient expressed understanding and agreed to proceed.   [name], MD 09/12/2019, 10:06 AM  Cc: Gordan Payment., MD

## 2019-12-17 ENCOUNTER — Other Ambulatory Visit: Payer: Self-pay | Admitting: Gastroenterology

## 2020-01-19 ENCOUNTER — Other Ambulatory Visit: Payer: Self-pay | Admitting: Gastroenterology

## 2020-01-19 DIAGNOSIS — K219 Gastro-esophageal reflux disease without esophagitis: Secondary | ICD-10-CM

## 2020-09-15 ENCOUNTER — Other Ambulatory Visit: Payer: Self-pay | Admitting: Gastroenterology

## 2020-09-15 DIAGNOSIS — R69 Illness, unspecified: Secondary | ICD-10-CM | POA: Diagnosis not present

## 2020-09-15 DIAGNOSIS — Z713 Dietary counseling and surveillance: Secondary | ICD-10-CM | POA: Diagnosis not present

## 2020-09-15 DIAGNOSIS — K219 Gastro-esophageal reflux disease without esophagitis: Secondary | ICD-10-CM

## 2020-10-01 ENCOUNTER — Ambulatory Visit (INDEPENDENT_AMBULATORY_CARE_PROVIDER_SITE_OTHER): Payer: Managed Care, Other (non HMO) | Admitting: Gastroenterology

## 2020-10-01 ENCOUNTER — Encounter: Payer: Self-pay | Admitting: Gastroenterology

## 2020-10-01 VITALS — HR 71 | Ht 61.0 in | Wt 183.1 lb

## 2020-10-01 DIAGNOSIS — K449 Diaphragmatic hernia without obstruction or gangrene: Secondary | ICD-10-CM

## 2020-10-01 DIAGNOSIS — R1310 Dysphagia, unspecified: Secondary | ICD-10-CM

## 2020-10-01 DIAGNOSIS — K219 Gastro-esophageal reflux disease without esophagitis: Secondary | ICD-10-CM | POA: Diagnosis not present

## 2020-10-01 NOTE — Progress Notes (Signed)
Chief Complaint: FU reflux.  Referring Provider:  Gordan Payment., MD      ASSESSMENT AND PLAN;   #1.  GERD with small HH with occ eso dysphagia  #2.  Weight gain.  Plan: -Change Protonix to Dexilant 60mg  po qd (30), 11 refills. Cards and coupons. -Continue Pepcid 20 mg p.o. nightly -Call in 2 weeks. -If not better, add GI cocktail -EGD with dil in May 2022 (she would like to schedule it in May). -Encouraged her to start walking and try to reduce weight.   HPI:    Laura Le is a 65 y.o. female   For follow-up visit.  Still having breakthrough symptoms despite Protonix 40 mg p.o. twice daily.  She had been on Dexilant previously with good results.  She would like to switch back to Dexilant.  Has been having occasional dysphagia mostly to solids-upper chest, not bad, has to wash with liquids.  Mostly happens with chicken.  Most recently she has gained 10 pounds as detailed below.  No odynophagia.  She denies having any diarrhea or constipation previously.  She ate out few days ago at [facility].  Had "GI bug"- N/V/diarrhea which is better now.   Wt Readings from Last 3 Encounters:  10/01/20 183 lb 2 oz (83.1 kg)  11/07/17 174 lb 8 oz (79.2 kg)     Past GI procedures: -EGD 09/2016: 2 cm HH, mild gastritis. CLO neg -Colonoscopy 09/2016: mod sig div, otherwise normal.  Repeat in 10 years.  Earlier, if with any new problems. -10/2016 02/2014: Mild hepatic steatosis.  03/2014. Past Medical History:  Diagnosis Date  . Depression   . Factor 5 Leiden mutation, heterozygous ([facility]) 11/07/2017   Dx at time of RLE DVT approx 2014  . GERD (gastroesophageal reflux disease) 11/07/2017  . HTN (hypertension), benign 11/07/2017  . Lower leg DVT (deep venous thromboembolism), chronic, right ([facility]) 11/07/2017   Occurred while on premarin approx 2014  . Uterine prolapse     Past Surgical History:  Procedure Laterality Date  . COLONOSCOPY  09/30/2016   Moderate  predominantl sigmoid diverticulosis. Otherwise, normal colonoscopy.  . ESOPHAGOGASTRODUODENOSCOPY  09/30/2016   Small hiatal hernia. Minimal gastritis.  10/02/2016 VAGINAL HYSTERECTOMY      Family History  Problem Relation Age of Onset  . Bladder Cancer Father   . Parkinson's disease Father   . Heart disease Mother   . Colon cancer Neg Hx     Social History   Tobacco Use  . Smoking status: Never Smoker  . Smokeless tobacco: Never Used  Vaping Use  . Vaping Use: Never used  Substance Use Topics  . Alcohol use: Yes    Alcohol/week: 0.0 standard drinks  . Drug use: Never    Current Outpatient Medications  Medication Sig Dispense Refill  . Cholecalciferol (VITAMIN D3 PO) Take by mouth.    . famotidine (PEPCID) 20 MG tablet TAKE 1 TABLET BY MOUTH EVERYDAY AT BEDTIME 90 tablet 1  . losartan (COZAAR) 25 MG tablet 1 tablet daily.    . pantoprazole (PROTONIX) 40 MG tablet TAKE 1 TABLET BY MOUTH EVERY DAY 90 tablet 2  . Probiotic Product (ALIGN) 4 MG CAPS Take by mouth.    . QSYMIA 3.75-23 MG CP24 Take 1 capsule by mouth every morning.    . sertraline (ZOLOFT) 50 MG tablet Take 50 mg by mouth daily.    . TURMERIC CURCUMIN PO Take by mouth.     No current facility-administered medications for  this visit.    No Known Allergies  Review of Systems:  neg     Physical Exam:    Pulse 71   Ht 5\' 1"  (1.549 m)   Wt 183 lb 2 oz (83.1 kg)   LMP  (LMP Unknown)   BMI 34.60 kg/m  Wt Readings from Last 3 Encounters:  10/01/20 183 lb 2 oz (83.1 kg)  11/07/17 174 lb 8 oz (79.2 kg)   Gen: awake, alert, NAD HEENT: anicteric, no pallor CV: RRR, no mrg Pulm: CTA b/l Abd: soft, NT/ND, +BS throughout Ext: no c/c/e Neuro: nonfocal  01/07/18, MD 10/01/2020, 11:54 AM  Cc: 10/03/2020., MD

## 2020-10-01 NOTE — Patient Instructions (Signed)
If you are age 65 or older, your body mass index should be between 23-30. Your Body mass index is 34.6 kg/m. If this is out of the aforementioned range listed, please consider follow up with your Primary Care Provider.  If you are age 26 or younger, your body mass index should be between 19-25. Your Body mass index is 34.6 kg/m. If this is out of the aformentioned range listed, please consider follow up with your Primary Care Provider.   We have sent the following medications to your pharmacy for you to pick up at your convenience:  Dexilant 60mg  daily  Continue taking Pepcid 20 mg at night  Please call in 2 weeks to let know how you are. We would like you to schedule a follow up om 6-8 weeks- please call is at [phone] to schedule.  We need to schedule and EGD with dilitation in 5/22 Due to recent changes in healthcare laws, you may see the results of your imaging and laboratory studies on MyChart before your provider has had a chance to review them.  We understand that in some cases there may be results that are confusing or concerning to you. Not all laboratory results come back in the same time frame and the provider may be waiting for multiple results in order to interpret others.  Please give 6/22 48 hours in order for your provider to thoroughly review all the results before contacting the office for clarification of your results.

## 2020-10-02 DIAGNOSIS — E559 Vitamin D deficiency, unspecified: Secondary | ICD-10-CM | POA: Diagnosis not present

## 2020-10-02 DIAGNOSIS — R69 Illness, unspecified: Secondary | ICD-10-CM | POA: Diagnosis not present

## 2020-10-02 DIAGNOSIS — R7303 Prediabetes: Secondary | ICD-10-CM | POA: Diagnosis not present

## 2020-10-02 DIAGNOSIS — D6851 Activated protein C resistance: Secondary | ICD-10-CM | POA: Diagnosis not present

## 2020-10-02 DIAGNOSIS — M8580 Other specified disorders of bone density and structure, unspecified site: Secondary | ICD-10-CM | POA: Diagnosis not present

## 2020-10-02 DIAGNOSIS — K219 Gastro-esophageal reflux disease without esophagitis: Secondary | ICD-10-CM | POA: Diagnosis not present

## 2020-10-02 DIAGNOSIS — E669 Obesity, unspecified: Secondary | ICD-10-CM | POA: Diagnosis not present

## 2020-10-02 DIAGNOSIS — E538 Deficiency of other specified B group vitamins: Secondary | ICD-10-CM | POA: Diagnosis not present

## 2020-10-02 DIAGNOSIS — Z86718 Personal history of other venous thrombosis and embolism: Secondary | ICD-10-CM | POA: Diagnosis not present

## 2020-10-02 DIAGNOSIS — I10 Essential (primary) hypertension: Secondary | ICD-10-CM | POA: Diagnosis not present

## 2020-10-14 ENCOUNTER — Telehealth: Payer: Self-pay | Admitting: Gastroenterology

## 2020-10-14 ENCOUNTER — Encounter: Payer: Self-pay | Admitting: Gastroenterology

## 2020-10-14 MED ORDER — DEXLANSOPRAZOLE 60 MG PO CPDR: 60.0000 mg | DELAYED_RELEASE_CAPSULE | Freq: Every day | ORAL | 11 refills | Status: DC

## 2020-10-14 NOTE — Telephone Encounter (Signed)
Patient called and states the pharmacy does not have the Dexilant medication please resend

## 2020-11-15 ENCOUNTER — Other Ambulatory Visit: Payer: Self-pay | Admitting: Gastroenterology

## 2020-12-09 DIAGNOSIS — L989 Disorder of the skin and subcutaneous tissue, unspecified: Secondary | ICD-10-CM | POA: Diagnosis not present

## 2020-12-09 DIAGNOSIS — R21 Rash and other nonspecific skin eruption: Secondary | ICD-10-CM | POA: Diagnosis not present

## 2020-12-09 DIAGNOSIS — Z87891 Personal history of nicotine dependence: Secondary | ICD-10-CM | POA: Diagnosis not present

## 2020-12-09 DIAGNOSIS — L259 Unspecified contact dermatitis, unspecified cause: Secondary | ICD-10-CM | POA: Diagnosis not present

## 2020-12-11 ENCOUNTER — Encounter: Payer: Managed Care, Other (non HMO) | Admitting: Gastroenterology

## 2020-12-12 ENCOUNTER — Encounter: Payer: Self-pay | Admitting: Gastroenterology

## 2020-12-15 DIAGNOSIS — L821 Other seborrheic keratosis: Secondary | ICD-10-CM | POA: Diagnosis not present

## 2020-12-15 DIAGNOSIS — L308 Other specified dermatitis: Secondary | ICD-10-CM | POA: Diagnosis not present

## 2020-12-15 DIAGNOSIS — L570 Actinic keratosis: Secondary | ICD-10-CM | POA: Diagnosis not present

## 2020-12-15 DIAGNOSIS — L810 Postinflammatory hyperpigmentation: Secondary | ICD-10-CM | POA: Diagnosis not present

## 2020-12-25 ENCOUNTER — Other Ambulatory Visit: Payer: Self-pay

## 2020-12-25 ENCOUNTER — Ambulatory Visit (AMBULATORY_SURGERY_CENTER): Payer: 59 | Admitting: *Deleted

## 2020-12-25 VITALS — Ht 61.0 in | Wt 170.0 lb

## 2020-12-25 DIAGNOSIS — K219 Gastro-esophageal reflux disease without esophagitis: Secondary | ICD-10-CM

## 2020-12-25 DIAGNOSIS — R1310 Dysphagia, unspecified: Secondary | ICD-10-CM

## 2020-12-25 NOTE — Progress Notes (Signed)
Patient's pre-visit was done today over the phone with the patient due to COVID-19 pandemic. Name,DOB and address verified. Insurance verified. Patient denies any allergies to Eggs and Soy. Patient denies any problems with anesthesia/sedation. Patient denies taking blood thinners. Pt aware to stop Phentermine 10 days before procedure. Packet of Prep instructions mailed to patient including a copy of a consent form-pt is aware.  Patient understands to call us back with any questions or concerns. Patient is aware of our care-partner policy and Covid-19 safety protocol. The patient is COVID-19 vaccinated, per patient.

## 2020-12-29 ENCOUNTER — Encounter: Payer: Self-pay | Admitting: Gastroenterology

## 2021-01-07 ENCOUNTER — Encounter: Payer: Managed Care, Other (non HMO) | Admitting: Gastroenterology

## 2021-01-22 ENCOUNTER — Other Ambulatory Visit: Payer: Self-pay | Admitting: Gastroenterology

## 2021-01-24 ENCOUNTER — Other Ambulatory Visit: Payer: Self-pay | Admitting: Gastroenterology

## 2021-01-26 ENCOUNTER — Telehealth: Payer: Self-pay | Admitting: Gastroenterology

## 2021-01-26 MED ORDER — PANTOPRAZOLE SODIUM 40 MG PO TBEC: 40.0000 mg | DELAYED_RELEASE_TABLET | Freq: Every day | ORAL | 2 refills | Status: DC

## 2021-01-26 NOTE — Telephone Encounter (Signed)
Patient called requesting refill on Pantoprazole said she is leaving out of town in about an hour and only has 1 pill left. Also mentioned that she is not taking the Dexilant due to cost.

## 2021-01-26 NOTE — Telephone Encounter (Signed)
Sent to [employer]

## 2021-02-23 DIAGNOSIS — I10 Essential (primary) hypertension: Secondary | ICD-10-CM | POA: Diagnosis not present

## 2021-02-23 DIAGNOSIS — D6851 Activated protein C resistance: Secondary | ICD-10-CM | POA: Diagnosis not present

## 2021-02-23 DIAGNOSIS — M8580 Other specified disorders of bone density and structure, unspecified site: Secondary | ICD-10-CM | POA: Diagnosis not present

## 2021-02-23 DIAGNOSIS — K219 Gastro-esophageal reflux disease without esophagitis: Secondary | ICD-10-CM | POA: Diagnosis not present

## 2021-02-23 DIAGNOSIS — E538 Deficiency of other specified B group vitamins: Secondary | ICD-10-CM | POA: Diagnosis not present

## 2021-02-23 DIAGNOSIS — E559 Vitamin D deficiency, unspecified: Secondary | ICD-10-CM | POA: Diagnosis not present

## 2021-02-23 DIAGNOSIS — Z86718 Personal history of other venous thrombosis and embolism: Secondary | ICD-10-CM | POA: Diagnosis not present

## 2021-02-23 DIAGNOSIS — R69 Illness, unspecified: Secondary | ICD-10-CM | POA: Diagnosis not present

## 2021-02-23 DIAGNOSIS — E669 Obesity, unspecified: Secondary | ICD-10-CM | POA: Diagnosis not present

## 2021-02-23 DIAGNOSIS — R7303 Prediabetes: Secondary | ICD-10-CM | POA: Diagnosis not present

## 2021-03-11 ENCOUNTER — Other Ambulatory Visit: Payer: Self-pay | Admitting: Gastroenterology

## 2021-03-11 DIAGNOSIS — K219 Gastro-esophageal reflux disease without esophagitis: Secondary | ICD-10-CM

## 2021-03-18 DIAGNOSIS — M79662 Pain in left lower leg: Secondary | ICD-10-CM | POA: Diagnosis not present

## 2021-03-18 DIAGNOSIS — M79605 Pain in left leg: Secondary | ICD-10-CM | POA: Diagnosis not present

## 2021-03-19 DIAGNOSIS — M545 Low back pain, unspecified: Secondary | ICD-10-CM | POA: Diagnosis not present

## 2021-04-24 DIAGNOSIS — Z20822 Contact with and (suspected) exposure to covid-19: Secondary | ICD-10-CM | POA: Diagnosis not present

## 2021-05-14 ENCOUNTER — Telehealth: Payer: Self-pay | Admitting: Gastroenterology

## 2021-05-14 NOTE — Telephone Encounter (Signed)
Inbound call from pt requesting a call back stating that she needs a EGD scheduled. She saw Dr. Chales Abrahams in feb. 2022. She wants to know if she needs another office visit before the EGD or can she just be scheduled for it. Please advise. Thank you.

## 2021-05-15 NOTE — Telephone Encounter (Signed)
Proceed with EGD at [facility] Phentermine has to be stopped 10 days before as per [facility] guidelines RG

## 2021-05-15 NOTE — Telephone Encounter (Signed)
Pt can be scheduled for a direct in the [facility] for egd with a previsit. See note below from Dr. Chales Abrahams.

## 2021-05-15 NOTE — Telephone Encounter (Signed)
Please see note below and Advise 

## 2021-05-21 ENCOUNTER — Encounter: Payer: Self-pay | Admitting: Gastroenterology

## 2021-06-29 DIAGNOSIS — L509 Urticaria, unspecified: Secondary | ICD-10-CM | POA: Diagnosis not present

## 2021-07-07 ENCOUNTER — Other Ambulatory Visit: Payer: Self-pay | Admitting: Gastroenterology

## 2021-07-09 ENCOUNTER — Telehealth: Payer: Self-pay | Admitting: Gastroenterology

## 2021-07-09 ENCOUNTER — Other Ambulatory Visit: Payer: Self-pay | Admitting: Gastroenterology

## 2021-07-09 NOTE — Telephone Encounter (Signed)
Dr. Chales Abrahams are you willing to increase Protonix 40mg  tablet to 2 times daily for this patient? Please advise.

## 2021-07-09 NOTE — Telephone Encounter (Signed)
Inbound call from patient requesting pantoprazole for 2 a day. [facility]

## 2021-07-13 ENCOUNTER — Ambulatory Visit (AMBULATORY_SURGERY_CENTER): Payer: 59 | Admitting: *Deleted

## 2021-07-13 ENCOUNTER — Other Ambulatory Visit: Payer: Self-pay

## 2021-07-13 VITALS — Ht 61.0 in | Wt 165.0 lb

## 2021-07-13 DIAGNOSIS — K219 Gastro-esophageal reflux disease without esophagitis: Secondary | ICD-10-CM

## 2021-07-13 DIAGNOSIS — R1310 Dysphagia, unspecified: Secondary | ICD-10-CM

## 2021-07-13 NOTE — Progress Notes (Signed)
No egg or soy allergy known to patient   issues known to pt with past sedation with any surgeries or procedures PONV  Patient denies ever being told they had issues or difficulty with intubation  No FH of Malignant Hyperthermia Pt is on diet pills Phentermine - dc by 12-9- 10 days prior - pt aware  Pt is not on  home 02  Pt is not on blood thinners  Pt denies issues with constipation  No A fib or A flutter  Pt is fully vaccinated  for Covid    Due to the COVID-19 pandemic we are asking patients to follow certain guidelines in PV and the [facility]   Pt aware of COVID protocols and [facility] guidelines   PV completed over the phone. Pt verified name, DOB, address and insurance during PV today.  Pt mailed instruction packet with copy of consent form to read and not return, and instructions.  Pt encouraged to call with questions or issues.  If pt has My chart, procedure instructions sent via My Chart

## 2021-07-14 NOTE — Telephone Encounter (Signed)
Okay to increase Protonix 40 mg p.o. twice daily x 4 weeks, then can reduce it to once a day RG

## 2021-07-15 MED ORDER — PANTOPRAZOLE SODIUM 40 MG PO TBEC: 40.0000 mg | DELAYED_RELEASE_TABLET | Freq: Two times a day (BID) | ORAL | 0 refills | Status: DC

## 2021-07-15 NOTE — Telephone Encounter (Signed)
PA approved for Protonix 40mg  2 times daily. Medication sent to pharmacy Auth number [id number]

## 2021-07-17 ENCOUNTER — Encounter: Payer: Self-pay | Admitting: Gastroenterology

## 2021-07-20 ENCOUNTER — Telehealth: Payer: Self-pay | Admitting: Gastroenterology

## 2021-07-20 NOTE — Telephone Encounter (Signed)
Spoke with the patient. Explained that she is okay to keep taking the medication as given for her hives. Patient states the hives are not contagious and she is going to her allergist on wednesday.

## 2021-07-20 NOTE — Telephone Encounter (Signed)
Patient called and wanted to let you know of two medications she's been on and wanted to make sure they wouldn't interfere with her upcoming procedures.  She was in [place] and they gave her an antihistamine:  Erispan Compuesto (spanish for betamethasone) - her last pill was yesterday and Prednisone, 3 5 mg tabs, then 2, then 1 and she will finish those on Wednesday.  Please call and let her know if this is OK.  Thank you.

## 2021-07-22 ENCOUNTER — Encounter: Payer: Self-pay | Admitting: Allergy

## 2021-07-22 ENCOUNTER — Other Ambulatory Visit: Payer: Self-pay

## 2021-07-22 ENCOUNTER — Ambulatory Visit (INDEPENDENT_AMBULATORY_CARE_PROVIDER_SITE_OTHER): Payer: Medicare Other | Admitting: Allergy

## 2021-07-22 VITALS — BP 134/88 | HR 99 | Temp 97.5°F | Resp 18 | Ht 61.0 in | Wt 172.8 lb

## 2021-07-22 DIAGNOSIS — L508 Other urticaria: Secondary | ICD-10-CM

## 2021-07-22 DIAGNOSIS — T783XXA Angioneurotic edema, initial encounter: Secondary | ICD-10-CM

## 2021-07-22 NOTE — Progress Notes (Signed)
New Patient Note  RE: Laura Le MRN: [id number] DOB: [birth year] Date of Office Visit: 07/22/2021   Primary care provider: Marden Noble, MD  Chief Complaint: Hives  History of present illness: Laura Le is a 65 y.o. female presenting today for evaluation of urticaria.  She had COVID in January 2021 and then did receive the Covid vaccine in Feb and March 2021 and did note itching that was mostly on her hands, wrist and ankles that started after vaccines.  At that time was not noticing any rash.    In May of this year she developed frank hives that she states were everywhere.  It was very itchy.  Se also notes swelling of her hands to the point that she could barely bend her wrist or fingers.  She went to ED as she was scared as she never had a rash like this before.  She got prednisone in the ED and states the hives resolved rather quickly.   She had a flare 2 weeks ago and received prednisone with that course as well.  She was in [place] last week and had another hive flare. She did see a doctor for this there and was given a steroid injection and prescription for Loratadine/Betamethasone tab as well as 2 creams (one appears to be calamine and the other I can not make out the handwriting on the prescription).   These topical therapies didn't help much at all.  She states she only took 2 doses of the loratadine/betamethasone.   She just finished a prednisone burst for these hive flare.   She states she started working out about 3 months ago and is having more joint aches pain.  She does report pain in her hands but this could arthritis. No fevers with the hives or swelling.  Hives have not left bruising behind once resolved.  Hives do appear to be pressure driven as they are worse than pressure areas.    She is having surgery mid January.  Review of systems: Review of Systems  Constitutional: Negative.   HENT: Negative.    Eyes: Negative.   Respiratory: Negative.    Cardiovascular:  Negative.   Gastrointestinal: Negative.   Musculoskeletal: Negative.   Skin: Negative.   Allergic/Immunologic: Negative.   Neurological: Negative.    All other systems negative unless noted above in HPI  Past medical history: Past Medical History:  Diagnosis Date   Clotting disorder (HCC)    Depression    situational with death of mom 36 yeras ago   Factor 5 Leiden mutation, heterozygous ([facility]) 11/07/2017   Dx at time of RLE DVT approx 2014   GERD (gastroesophageal reflux disease) 11/07/2017   HTN (hypertension), benign 11/07/2017   on  meds   Lower leg DVT (deep venous thromboembolism), chronic, right ([facility]CC) 11/07/2017   Occurred while on premarin approx 2014   PONV (postoperative nausea and vomiting)    general anesthesia   Post-operative nausea and vomiting    Urticaria    Uterine prolapse     Past surgical history: Past Surgical History:  Procedure Laterality Date   COLONOSCOPY  09/30/2016   Moderate predominantl sigmoid diverticulosis. Otherwise, normal colonoscopy.   ESOPHAGOGASTRODUODENOSCOPY  09/30/2016   Small hiatal hernia. Minimal gastritis.   eye lid lift Bilateral    HERNIA REPAIR     tumor  removed from breast     benign   VAGINAL HYSTERECTOMY      Family history:  Family History  Problem Relation Age  of Onset   Heart disease Mother    Bladder Cancer Father    Parkinson's disease Father    Colon cancer Neg Hx    Esophageal cancer Neg Hx    Stomach cancer Neg Hx    Rectal cancer Neg Hx    Colon polyps Neg Hx     Social history: She lives in a home with carpeting with gas heating and central cooling.  There are no no pets in the home.  There is no concern for water damage, mildew or roaches in the home.  She is an Network engineer.  She denies a smoking history.   Medication List: Current Outpatient Medications  Medication Sig Dispense Refill   aspirin EC 81 MG tablet Take 81 mg by mouth daily. Swallow whole.     Cholecalciferol (VITAMIN D3  PO) Take by mouth.     escitalopram (LEXAPRO) 10 MG tablet Take by mouth.     famotidine (PEPCID) 20 MG tablet 1 tablet     losartan (COZAAR) 25 MG tablet 1 tablet daily.     Magnesium 200 MG TABS 1 tablet with a meal     Magnesium 400 MG CAPS Take by mouth.     pantoprazole (PROTONIX) 40 MG tablet TAKE 1 TABLET BY MOUTH EVERY DAY 90 tablet 2   pantoprazole (PROTONIX) 40 MG tablet Take 1 tablet (40 mg total) by mouth 2 (two) times daily. Take 1 tablet 2 times daily for 4 weeks then decrease to once daily 180 tablet 0   phentermine 15 MG capsule Take 15 mg by mouth daily.     Probiotic Product (ALIGN) 4 MG CAPS Take by mouth.     Turmeric 500 MG CAPS See admin instructions.     No current facility-administered medications for this visit.    Known medication allergies: No Known Allergies   Physical examination: Blood pressure 134/88, pulse 99, temperature (!) 97.5 F (36.4 C), resp. rate 18, height 5\' 1"  (1.549 m), weight 172 lb 12.8 oz (78.4 kg), SpO2 98 %.  General: Alert, interactive, in no acute distress. HEENT: PERRLA, TMs pearly gray, turbinates non-edematous without discharge, post-pharynx non erythematous. Neck: Supple without lymphadenopathy. Lungs: Clear to auscultation without wheezing, rhonchi or rales. {no increased work of breathing. CV: Normal S1, S2 without murmurs. Abdomen: Nondistended, nontender. Skin: Scattered erythematous urticarial type lesions primarily located left forearm , nonvesicular. Extremities:  No clubbing, cyanosis or edema. Neuro:   Grossly intact.  Diagnositics/Labs:  Allergy testing: deferred due to ongoing urticaria   Assessment and plan: Chronic urticaria with angioedema  - at this time etiology of hives and swelling is unknown however may have been driven initially by Covid illness and/or vaccines.  We have seen hives/swelling occur in situations that activate the immune system like in illness or receiving vaccines.  Hives can be caused by  a variety of different triggers including illness/infection, foods, medications, stings, exercise, pressure, vibrations, extremes of temperature to name a few however majority of the time there is no identifiable trigger.  Stress can flare or heighten hives/swelling.  Your symptoms have been ongoing for >6 weeks making this chronic thus will obtain labwork to evaluate: CBC w diff, CMP, tryptase, hive panel, environmental panel, alpha-gal panel, inflammatory markers  - for hive management recommend the following high-dose antihistamine regimen: Allegra 180mg  1 tab twice a day with Famotidine 20mg  1 tab twice a day.   Continue this regimen if ok by your surgeon to help decrease risk of hive flare  up related to physical stress - if Allegra and Famotidine twice a day dosing is not effective enough in controlling hives then recommend adding Singulair (an anti-leukotriene).  If triple therapy regimen is not effective enough then would recommend Xolair monthly injections.  Xolair is effective for chronic spontaneous hives/swelling.  We will discuss this in more detail if needed in future.    Follow-up in 2-3 months or sooner if needed  I appreciate the opportunity to take part in Addley's care. Please do not hesitate to contact me with questions.  Sincerely,   Margo Aye, MD Allergy/Immunology [facility]

## 2021-07-22 NOTE — Patient Instructions (Addendum)
Chronic urticaria  - at this time etiology of hives and swelling is unknown however may have been driven initially by Covid illness and/or vaccines.  We have seen hives/swelling occur in situations that activate the immune system like in illness or receiving vaccines.  Hives can be caused by a variety of different triggers including illness/infection, foods, medications, stings, exercise, pressure, vibrations, extremes of temperature to name a few however majority of the time there is no identifiable trigger.  Stress can flare or heighten hives/swelling.  Your symptoms have been ongoing for >6 weeks making this chronic thus will obtain labwork to evaluate: CBC w diff, CMP, tryptase, hive panel, environmental panel, alpha-gal panel, inflammatory markers  - for hive management recommend the following high-dose antihistamine regimen: Allegra 180mg  1 tab twice a day with Famotidine 20mg  1 tab twice a day.   Continue this regimen if ok by your surgeon to help decrease risk of hive flare up related to physical stress - if Allegra and Famotidine twice a day dosing is not effective enough in controlling hives then recommend adding Singulair (an anti-leukotriene).  If triple therapy regimen is not effective enough then would recommend Xolair monthly injections.  Xolair is effective for chronic spontaneous hives/swelling.  We will discuss this in more detail if needed in future.    Follow-up in 2-3 months or sooner if needed

## 2021-07-24 ENCOUNTER — Telehealth: Payer: Self-pay | Admitting: Gastroenterology

## 2021-07-24 NOTE — Telephone Encounter (Signed)
Hey Dr. Chales Abrahams,   Inbound call from patient to cancel EGD procedure 12/20. States she have an upper respiratory infection. I cancelled her appt and let her know she will have to reschedule 6 weeks out. Patient states she will call to reschedule.   Thank you

## 2021-07-24 NOTE — Telephone Encounter (Signed)
Thanks for letting me know RG 

## 2021-07-28 ENCOUNTER — Encounter: Payer: 59 | Admitting: Gastroenterology

## 2021-07-31 LAB — COMPREHENSIVE METABOLIC PANEL
ALT: 13 IU/L (ref 0–32)
AST: 12 IU/L (ref 0–40)
Albumin/Globulin Ratio: 1.9 (ref 1.2–2.2)
Albumin: 4.4 g/dL (ref 3.8–4.8)
Alkaline Phosphatase: 75 IU/L (ref 44–121)
BUN/Creatinine Ratio: 26 (ref 12–28)
BUN: 21 mg/dL (ref 8–27)
Bilirubin Total: 0.3 mg/dL (ref 0.0–1.2)
CO2: 23 mmol/L (ref 20–29)
Calcium: 9.7 mg/dL (ref 8.7–10.3)
Chloride: 101 mmol/L (ref 96–106)
Creatinine, Ser: 0.8 mg/dL (ref 0.57–1.00)
Globulin, Total: 2.3 g/dL (ref 1.5–4.5)
Glucose: 89 mg/dL (ref 70–99)
Potassium: 4.2 mmol/L (ref 3.5–5.2)
Sodium: 139 mmol/L (ref 134–144)
Total Protein: 6.7 g/dL (ref 6.0–8.5)
eGFR: 82 mL/min/{1.73_m2} (ref >59)

## 2021-07-31 LAB — CBC WITH DIFFERENTIAL
Basophils Absolute: 0 10*3/uL (ref 0.0–0.2)
Basos: 1 %
EOS (ABSOLUTE): 0.1 10*3/uL (ref 0.0–0.4)
Eos: 2 %
Hematocrit: 41.1 % (ref 34.0–46.6)
Hemoglobin: 13.9 g/dL (ref 11.1–15.9)
Immature Grans (Abs): 0 10*3/uL (ref 0.0–0.1)
Immature Granulocytes: 0 %
Lymphocytes Absolute: 2.6 10*3/uL (ref 0.7–3.1)
Lymphs: 35 %
MCH: 31 pg (ref 26.6–33.0)
MCHC: 33.8 g/dL (ref 31.5–35.7)
MCV: 92 fL (ref 79–97)
Monocytes Absolute: 0.8 10*3/uL (ref 0.1–0.9)
Monocytes: 10 %
Neutrophils Absolute: 3.9 10*3/uL (ref 1.4–7.0)
Neutrophils: 52 %
RBC: 4.48 x10E6/uL (ref 3.77–5.28)
RDW: 11.9 % (ref 11.7–15.4)
WBC: 7.6 10*3/uL (ref 3.4–10.8)

## 2021-07-31 LAB — ALLERGENS W/TOTAL IGE AREA 2

## 2021-07-31 LAB — SEDIMENTATION RATE: Sed Rate: 30 mm/hr (ref 0–40)

## 2021-07-31 LAB — ALPHA-GAL PANEL
Allergen Lamb IgE: <0.1 kU/L
Beef IgE: <0.1 kU/L
IgE (Immunoglobulin E), Serum: 149 IU/mL (ref 6–495)
O215-IgE Alpha-Gal: <0.1 kU/L
Pork IgE: <0.1 kU/L

## 2021-07-31 LAB — CHRONIC URTICARIA: cu index: <2.5 (ref <10)

## 2021-07-31 LAB — ANA W/REFLEX: Anti Nuclear Antibody (ANA): NEGATIVE

## 2021-07-31 LAB — THYROID ANTIBODIES
Thyroglobulin Antibody: <1 IU/mL (ref 0.0–0.9)
Thyroperoxidase Ab SerPl-aCnc: 10 IU/mL (ref 0–34)

## 2021-07-31 LAB — TRYPTASE: Tryptase: 3.1 ug/L (ref 2.2–13.2)

## 2021-08-12 NOTE — Telephone Encounter (Signed)
Patient called from to reschedule EGD. Patient states her upper respirtory infection came back. I advised she could not be scheduled until 6 additional weeks from being over the infection since it came back

## 2021-09-13 ENCOUNTER — Other Ambulatory Visit: Payer: Self-pay | Admitting: Gastroenterology

## 2021-09-17 ENCOUNTER — Other Ambulatory Visit: Payer: Self-pay | Admitting: Gastroenterology

## 2021-09-17 DIAGNOSIS — K219 Gastro-esophageal reflux disease without esophagitis: Secondary | ICD-10-CM

## 2021-09-30 ENCOUNTER — Telehealth: Payer: Self-pay | Admitting: Allergy

## 2021-09-30 ENCOUNTER — Other Ambulatory Visit: Payer: Self-pay | Admitting: *Deleted

## 2021-09-30 MED ORDER — FAMOTIDINE 20 MG PO TABS: 20.0000 mg | ORAL_TABLET | Freq: Two times a day (BID) | ORAL | 5 refills | Status: DC

## 2021-09-30 NOTE — Telephone Encounter (Signed)
Refills have been sent in. Called patient and advised, patient verbalized understanding.  

## 2021-09-30 NOTE — Telephone Encounter (Signed)
Laura Le would [facility] [place]

## 2021-10-12 ENCOUNTER — Telehealth: Payer: Self-pay | Admitting: Allergy

## 2021-10-12 ENCOUNTER — Other Ambulatory Visit: Payer: Self-pay

## 2021-10-12 NOTE — Telephone Encounter (Signed)
Please send in a prescription for prednisone 10 mg taking 2 tablets twice a day for 3 days, then on the 4th day take 2 tablets in the morning, and on the 5th day take one tablet and stop. Quantity 15 tablets with no refills. Make sure she keeps her follow up appointment on 10/28/21 @ 3pm

## 2021-10-12 NOTE — Telephone Encounter (Signed)
Patient called to say she is covered in hives. When she saw Dr. Delorse Lek last, she was put on Allegra. Dr. Delorse Lek said if that didn't work, she may need a prescription for Singulair and may need prednisone. Patient is with Dr. Kathyrn Lass wife, Laura Le, on [address] and when you call her, you will need to call Mrs. Kathyrn Lass phone, because patient does not have her phone with her. Phone #: [phone]. Patient states she needs this medication called in by 12:00 today in order for the boat to be able to get it to her on this [place]. Pharmacy is [facility] [phone].  ?

## 2021-10-12 NOTE — Telephone Encounter (Signed)
Called patient - DOB verified - advised of provider's notation; added Montelukast 10 mg 1 tablet at bedtime #30 RF 0. Patient verbalized understanding of all instructions, no further questions. ? ?Called in Prednisone and Montelukast prescriptions Gabriel Cirri, Education administrator at [employer] [phone]. ?

## 2021-10-20 ENCOUNTER — Encounter: Payer: Self-pay | Admitting: Gastroenterology

## 2021-10-20 ENCOUNTER — Telehealth: Payer: Self-pay | Admitting: Gastroenterology

## 2021-10-20 NOTE — Telephone Encounter (Signed)
Patient requested to reschedule to 11/16/21 at 10:00 am please send new prep instructions. ?

## 2021-10-20 NOTE — Telephone Encounter (Signed)
New 4-10 EGD instructions mailed to pt  ?

## 2021-10-21 ENCOUNTER — Encounter: Payer: 59 | Admitting: Gastroenterology

## 2021-10-28 ENCOUNTER — Other Ambulatory Visit: Payer: Self-pay

## 2021-10-28 ENCOUNTER — Encounter: Payer: Self-pay | Admitting: Allergy

## 2021-10-28 ENCOUNTER — Ambulatory Visit (INDEPENDENT_AMBULATORY_CARE_PROVIDER_SITE_OTHER): Payer: Medicare Other | Admitting: Allergy

## 2021-10-28 VITALS — BP 144/70 | HR 80 | Temp 97.2°F | Resp 16 | Ht 61.0 in | Wt 165.4 lb

## 2021-10-28 DIAGNOSIS — L508 Other urticaria: Secondary | ICD-10-CM

## 2021-10-28 NOTE — Progress Notes (Signed)
? ? ?Follow-up Note ? ?RE: Laura Le MRN: [id number] DOB: May 31, 1956 ?Date of Office Visit: 10/28/2021 ? ? ?History of present illness: ?Laura Le is a 66 y.o. female presenting today for follow-up of chronic hives.  She was last seen in the office on 07/22/2021 by myself.  Since this visit she did have an illness around March 6 and this was treated with Augmentin as she states on the urgent care that she may have pneumonia.  She states she has had 2 more episodes of hives outbreaks since then.  She has been taking Allegra and Pepcid twice a day and at times having to take 3 Allegra's to help with the hives.  I did prescribe Singulair at the last visit and she states she did take it for 2 days however has continued to use due to the blackbox warning labeling. ?She is going to [place] this Saturday for a week.  She is worried that she may have more hives there as she had an initial hive outbreak while she was in [place]. ?She is interested at this time proceeding with Xolair to help better control her hives. ? ? ? ?Review of systems: ?Review of Systems  ?Constitutional: Negative.   ?HENT: Negative.    ?Eyes: Negative.   ?Respiratory: Negative.    ?Cardiovascular: Negative.   ?Gastrointestinal: Negative.   ?Musculoskeletal: Negative.   ?Skin:  Positive for rash.  ?Allergic/Immunologic: Negative.   ?Neurological: Negative.    ? ?All other systems negative unless noted above in HPI ? ?Past medical/social/surgical/family history have been reviewed and are unchanged unless specifically indicated below. ? ?No changes ? ?Medication List: ?Current Outpatient Medications  ?Medication Sig Dispense Refill  ? aspirin EC 81 MG tablet Take 81 mg by mouth daily. Swallow whole.    ? Cholecalciferol (VITAMIN D3 PO) Take by mouth.    ? escitalopram (LEXAPRO) 10 MG tablet Take by mouth.    ? famotidine (PEPCID) 20 MG tablet 1 tablet    ? famotidine (PEPCID) 20 MG tablet Take 1 tablet (20 mg total) by mouth 2 (two) times daily. 60  tablet 5  ? fexofenadine (ALLEGRA) 180 MG tablet Take 1 tablet by mouth every morning.    ? losartan (COZAAR) 25 MG tablet 1 tablet daily.    ? Magnesium 200 MG TABS 1 tablet with a meal    ? pantoprazole (PROTONIX) 40 MG tablet TAKE 1 TABLET BY MOUTH EVERY DAY 90 tablet 2  ? Probiotic Product (ALIGN) 4 MG CAPS Take by mouth.    ? Turmeric 500 MG CAPS See admin instructions.    ? valACYclovir (VALTREX) 500 MG tablet Take 500 mg by mouth as directed.    ? ?No current facility-administered medications for this visit.  ?  ? ?Known medication allergies: ?No Known Allergies ? ? ?Physical examination: ?Blood pressure (!) 144/70, pulse 80, temperature (!) 97.2 ?F (36.2 ?C), resp. rate 16, height 5' 1"  (1.549 m), weight 165 lb 6.4 oz (75 kg), SpO2 98 %. ? ?General: Alert, interactive, in no acute distress. ?HEENT: PERRLA, TMs pearly gray, turbinates non-edematous without discharge, post-pharynx non erythematous. ?Neck: Supple without lymphadenopathy. ?Lungs: Clear to auscultation without wheezing, rhonchi or rales. {no increased work of breathing. ?CV: Normal S1, S2 without murmurs. ?Abdomen: Nondistended, nontender. ?Skin: Warm and dry, without lesions or rashes. ?Extremities:  No clubbing, cyanosis or edema. ?Neuro:   Grossly intact. ? ?Diagnositics/Labs: ?Labs: ?Component ?    Latest Ref Rng 07/22/2021  ?D Pteronyssinus IgE ?  Class 0 kU/L <0.10   ?D Farinae IgE ?    Class 0 kU/L <0.10   ?Cat Dander IgE ?    Class 0 kU/L <0.10   ?Dog Dander IgE ?    Class 0 kU/L <0.10   ?[place] Grass IgE ?    Class 0 kU/L <0.10   ?Timothy Grass IgE ?    Class 0 kU/L <0.10   ?Johnson Grass IgE ?    Class 0 kU/L <0.10   ?Cockroach, [place] IgE ?    Class 0 kU/L <0.10   ?Penicillium Chrysogen IgE ?    Class 0 kU/L <0.10   ?Cladosporium Herbarum IgE ?    Class 0 kU/L <0.10   ?Aspergillus Fumigatus IgE ?    Class 0 kU/L <0.10   ?Alternaria Alternata IgE ?    Class 0 kU/L <0.10   ?Maple/Box Elder IgE ?    Class 0 kU/L <0.10   ?Common Silver  Wendee Copp IgE ?    Class 0 kU/L <0.10   ?Maypearl IgE ?    Class 0 kU/L <0.10   ?Oak, [place] IgE ?    Class 0 kU/L <0.10   ?Elm, American IgE ?    Class 0 kU/L <0.10   ?Cottonwood IgE ?    Class 0 kU/L <0.10   ?Pecan, Hickory IgE ?    Class 0 kU/L <0.10   ?White Mulberry IgE ?    Class 0 kU/L <0.10   ?Ragweed, Short IgE ?    Class 0 kU/L <0.10   ?Pigweed, Rough IgE ?    Class 0 kU/L <0.10   ?Sheep Sorrel IgE Qn ?    Class 0 kU/L <0.10   ?Mouse Urine IgE ?    Class 0 kU/L <0.10   ?WBC ?    3.4 - 10.8 x10E3/uL 7.6   ?RBC ?    3.77 - 5.28 x10E6/uL 4.48   ?Hemoglobin ?    11.1 - 15.9 g/dL 13.9   ?HCT ?    34.0 - 46.6 % 41.1   ?MCV ?    79 - 97 fL 92   ?MCH ?    26.6 - 33.0 pg 31.0   ?MCHC ?    31.5 - 35.7 g/dL 33.8   ?RDW ?    11.7 - 15.4 % 11.9   ?Neutrophils ?    Not Estab. % 52   ?Lymphs ?    Not Estab. % 35   ?Monocytes ?    Not Estab. % 10   ?Eos ?    Not Estab. % 2   ?Basos ?    Not Estab. % 1   ?NEUT# ?    1.4 - 7.0 x10E3/uL 3.9   ?Lymphocyte # ?    0.7 - 3.1 x10E3/uL 2.6   ?Monocytes Absolute ?    0.1 - 0.9 x10E3/uL 0.8   ?EOS (ABSOLUTE) ?    0.0 - 0.4 x10E3/uL 0.1   ?Basophils Absolute ?    0.0 - 0.2 x10E3/uL 0.0   ?Immature Granulocytes ?    Not Estab. % 0   ?Immature Grans (Abs) ?    0.0 - 0.1 x10E3/uL 0.0   ?Glucose ?    70 - 99 mg/dL 89   ?BUN ?    8 - 27 mg/dL 21   ?Creatinine ?    0.57 - 1.00 mg/dL 0.80   ?eGFR ?    >59 mL/min/1.73 82   ?BUN/Creatinine Ratio ?  12 - 28  26   ?Sodium ?    134 - 144 mmol/L 139   ?Potassium ?    3.5 - 5.2 mmol/L 4.2   ?Chloride ?    96 - 106 mmol/L 101   ?CO2 ?    20 - 29 mmol/L 23   ?Calcium ?    8.7 - 10.3 mg/dL 9.7   ?Total Protein ?    6.0 - 8.5 g/dL 6.7   ?Albumin ?    3.8 - 4.8 g/dL 4.4   ?Globulin, Total ?    1.5 - 4.5 g/dL 2.3   ?Albumin/Globulin Ratio ?    1.2 - 2.2  1.9   ?Total Bilirubin ?    0.0 - 1.2 mg/dL 0.3   ?Alkaline Phosphatase ?    44 - 121 IU/L 75   ?AST ?    0 - 40 IU/L 12   ?ALT ?    0 - 32 IU/L 13   ?Class Description Allergens Comment   ?IgE  (Immunoglobulin E), Serum ?    6 - 495 IU/mL 149   ?O215-IgE Alpha-Gal ?    Class 0 kU/L <0.10   ?Beef IgE ?    Class 0 kU/L <0.10   ?Pork IgE ?    Class 0 kU/L <0.10   ?Allergen Lamb IgE ?    Class 0 kU/L <0.10   ?Thyroperoxidase Ab SerPl-aCnc ?    0 - 34 IU/mL 10   ?Thyroglobulin Antibody ?    0.0 - 0.9 IU/mL <1.0   ?Anti Nuclear Antibody (ANA) ?    Negative  Negative   ?Sed Rate ?    0 - 40 mm/hr 30   ?Tryptase ?    2.2 - 13.2 ug/L 3.1   ?cu index ?    <10  <2.5   ? ? ? ?Assessment and plan: ?  ?Chronic urticaria ? - at this time etiology of hives is spontaneous in nature.  Your recent episode was driven by immune system activation from illness ? - your labwork was reassuring ? - for hive management recommend the following high-dose antihistamine regimen: Allegra 176m 1 tab twice a day (max dosing is 4 tabs/day) with Famotidine 211m1 tab twice a day.      ? - will proceed with Xolair monthly injections for control of chronic spontaneous hives.  Xolair injections including benefits, risks and protocol discussed today.  Will start approval process.  Will send in epinephrine device to have with you on xolair injection days.  Informational handout provided.   ? - she was provided with a Pred taper pack from the office to take with her on her international trip in case she were to develop hives while abroad. ? ?You can schedule new start Xolair appt in [place] in about 2 weeks.  Tammy our biologics coordinator will call you with approval information.  ? ?Follow-up in 4-6 months or sooner if needed in [place] ? ?I appreciate the opportunity to take part in Delores's care. Please do not hesitate to contact me with questions. ? ?Sincerely, ? ? ?ShPrudy FeelerMD ?Allergy/Immunology ?[facility] ? ? ?

## 2021-10-28 NOTE — Patient Instructions (Addendum)
Chronic urticaria ? - at this time etiology of hives is spontaneous in nature.  Your recent episode was driven by immune system activation from illness ? - your labwork was reassuring ? - for hive management recommend the following high-dose antihistamine regimen: Allegra 180mg  1 tab twice a day (max dosing is 4 tabs/day) with Famotidine 20mg  1 tab twice a day.      ? - will proceed with Xolair monthly injections for control of chronic spontaneous hives.  Xolair injections including benefits, risks and protocol discussed today.  Will start approval process.  Will send in epinephrine device to have with you on xolair injection days.  Informational handout provided.   ? ?You can schedule new start Xolair appt in [place] in about 2 weeks.  Tammy our biologics coordinator will call you with approval information.  ? ?Follow-up in 4-6 months or sooner if needed in [place] ?

## 2021-11-08 ENCOUNTER — Encounter: Payer: Self-pay | Admitting: Certified Registered Nurse Anesthetist

## 2021-11-15 ENCOUNTER — Encounter: Payer: Self-pay | Admitting: Certified Registered Nurse Anesthetist

## 2021-11-16 ENCOUNTER — Encounter: Payer: Self-pay | Admitting: Gastroenterology

## 2021-11-16 ENCOUNTER — Telehealth: Payer: Self-pay

## 2021-11-16 ENCOUNTER — Ambulatory Visit (AMBULATORY_SURGERY_CENTER): Payer: Medicare Other | Admitting: Gastroenterology

## 2021-11-16 VITALS — BP 139/82 | HR 64 | Temp 97.3°F | Resp 19 | Ht 61.0 in | Wt 165.0 lb

## 2021-11-16 DIAGNOSIS — K449 Diaphragmatic hernia without obstruction or gangrene: Secondary | ICD-10-CM | POA: Diagnosis not present

## 2021-11-16 DIAGNOSIS — R1310 Dysphagia, unspecified: Secondary | ICD-10-CM | POA: Diagnosis not present

## 2021-11-16 DIAGNOSIS — K2970 Gastritis, unspecified, without bleeding: Secondary | ICD-10-CM

## 2021-11-16 DIAGNOSIS — K222 Esophageal obstruction: Secondary | ICD-10-CM | POA: Diagnosis not present

## 2021-11-16 MED ORDER — SODIUM CHLORIDE 0.9 % IV SOLN: 500.0000 mL | Freq: Once | INTRAVENOUS | Status: DC

## 2021-11-16 NOTE — Progress Notes (Signed)
Report given to PACU, vss 

## 2021-11-16 NOTE — Progress Notes (Signed)
0945 Robinul 0.1 mg IV given due large amount of secretions upon assessment.  MD made aware, vss 

## 2021-11-16 NOTE — Telephone Encounter (Signed)
Pt Husband Amada Jupiter Hassell was contacted and attempted to schedule an Colonoscopy per Dr. Chales Abrahams request: Pt stated that he wants to schedule near November of this year:Pt was notified to call our office closer to that month:  Pt verbalized understanding with all questions answered.  ? ?

## 2021-11-16 NOTE — Op Note (Signed)
[facility] ?Patient Name: Laura Le ?Procedure Date: 11/16/2021 9:37 AM ?MRN: [id number] ?Endoscopist: Lynann Bologna , MD ?Age: 66 ?Referring MD:  ?Date of Birth: [birth year] ?Gender: Female ?Account #: [id number] ?Procedure:                Upper GI endoscopy ?Indications:              Dysphagia ?Medicines:                Monitored Anesthesia Care ?Procedure:                Pre-Anesthesia Assessment: ?                          - Prior to the procedure, a History and Physical  ?                          was performed, and patient medications and  ?                          allergies were reviewed. The patient's tolerance of  ?                          previous anesthesia was also reviewed. The risks  ?                          and benefits of the procedure and the sedation  ?                          options and risks were discussed with the patient.  ?                          All questions were answered, and informed consent  ?                          was obtained. Prior Anticoagulants: The patient has  ?                          taken no previous anticoagulant or antiplatelet  ?                          agents. ASA Grade Assessment: II - A patient with  ?                          mild systemic disease. After reviewing the risks  ?                          and benefits, the patient was deemed in  ?                          satisfactory condition to undergo the procedure. ?                          After obtaining informed consent, the endoscope was  ?  passed under direct vision. Throughout the  ?                          procedure, the patient's blood pressure, pulse, and  ?                          oxygen saturations were monitored continuously. The  ?                          GIF [id number] #[id number] was introduced through the  ?                          mouth, and advanced to the second part of duodenum.  ?                          The upper GI endoscopy was accomplished without  ?                           difficulty. The patient tolerated the procedure  ?                          well. ?Scope In: ?Scope Out: ?Findings:                 A mild wide open Schatzki ring was found at the  ?                          gastroesophageal junction, 32 cm from the incisors.  ?                          The scope was withdrawn. Dilation was performed  ?                          with a Maloney dilator with mild resistance at 52  ?                          Fr and 54 Fr. Biopsies were obtained from the  ?                          proximal and distal esophagus with cold forceps for  ?                          histology of suspected eosinophilic esophagitis. ?                          A 2 cm hiatal hernia was present extending from 32  ?                          cm up to 34 cm (GE Jn to diaphragmatic hiatus). ?                          Localized mild inflammation characterized by  ?  erythema was found in the gastric antrum. Biopsies  ?                          were taken with a cold forceps for histology. ?                          The examined duodenum was normal. Biopsies for  ?                          histology were taken with a cold forceps for  ?                          evaluation of celiac disease. ?Complications:            No immediate complications. ?Estimated Blood Loss:     Estimated blood loss: none. ?Impression:               - Mild Schatzki ring. Dilated. Biopsied. ?                          - 2 cm hiatal hernia. ?                          - Gastritis. Biopsied. ?                          - Normal examined duodenum. Biopsied. ?Recommendation:           - Patient has a contact number available for  ?                          emergencies. The signs and symptoms of potential  ?                          delayed complications were discussed with the  ?                          patient. Return to normal activities tomorrow.  ?                          Written discharge  instructions were provided to the  ?                          patient. ?                          - Post dilatation diet. ?                          - Continue present medications including Protonix  ?                          40 mg p.o every morning, Pepcid 20 mg p.o. nightly  ?                          x 2 weeks, then can stop Pepcid and continue  ?  Protonix. ?                          - Await pathology results. ?                          - The findings and recommendations were discussed  ?                          with the patient's family. ?Lynann Bolognaajesh Deysy Schabel, MD ?11/16/2021 10:07:52 AM ?This report has been signed electronically. ?

## 2021-11-16 NOTE — Patient Instructions (Addendum)
? ?FOLLOW DILATATION DIET TODAY- HANDOUT GIVEN TO YOU ? ? ?HANDOUTS ON GASTRITIS & HIATAL HERNIA GIVEN TO YOU TODAY ? ?AWAIT BIOPSY RESULTS ? ? ?CONTINUE PRESENT MEDICATIONS INCLUDING PROTONIX 40 MG EVERY MORNING ,PEPCID 20 MG TWICE DAILY ? ? ? ?YOU HAD AN ENDOSCOPIC PROCEDURE TODAY AT [facility]:   Refer to the procedure report that was given to you for any specific questions about what was found during the examination.  If the procedure report does not answer your questions, please call your gastroenterologist to clarify.  If you requested that your care partner not be given the details of your procedure findings, then the procedure report has been included in a sealed envelope for you to review at your convenience later. ? ?YOU SHOULD EXPECT: Some feelings of bloating in the abdomen. Passage of more gas than usual.  Walking can help get rid of the air that was put into your GI tract during the procedure and reduce the bloating. If you had a lower endoscopy (such as a colonoscopy or flexible sigmoidoscopy) you may notice spotting of blood in your stool or on the toilet paper. If you underwent a bowel prep for your procedure, you may not have a normal bowel movement for a few days. ? ?Please Note:  You might notice some irritation and congestion in your nose or some drainage.  This is from the oxygen used during your procedure.  There is no need for concern and it should clear up in a day or so. ? ?SYMPTOMS TO REPORT IMMEDIATELY: ? ?Following lower endoscopy (colonoscopy or flexible sigmoidoscopy): ? Excessive amounts of blood in the stool ? Significant tenderness or worsening of abdominal pains ? Swelling of the abdomen that is new, acute ? Fever of 100?F or higher ? ?Following upper endoscopy (EGD) ? Vomiting of blood or coffee ground material ? New chest pain or pain under the shoulder blades ? Painful or persistently difficult swallowing ? New shortness of breath ? Fever of 100?F or  higher ? Black, tarry-looking stools ? ?For urgent or emergent issues, a gastroenterologist can be reached at any hour by calling [phone]. ?Do not use MyChart messaging for urgent concerns.  ? ? ?DIET:  We do recommend a small meal at first, but then you may proceed to your regular diet.  Drink plenty of fluids but you should avoid alcoholic beverages for 24 hours. ? ?ACTIVITY:  You should plan to take it easy for the rest of today and you should NOT DRIVE or use heavy machinery until tomorrow (because of the sedation medicines used during the test).   ? ?FOLLOW UP: ?Our staff will call the number listed on your records 48-72 hours following your procedure to check on you and address any questions or concerns that you may have regarding the information given to you following your procedure. If we do not reach you, we will leave a message.  We will attempt to reach you two times.  During this call, we will ask if you have developed any symptoms of COVID 19. If you develop any symptoms (ie: fever, flu-like symptoms, shortness of breath, cough etc.) before then, please call [phone].  If you test positive for Covid 19 in the 2 weeks post procedure, please call and report this information to [place].   ? ?If any biopsies were taken you will be contacted by phone or by letter within the next 1-3 weeks.  Please call us at [phone] if you have  not heard about the biopsies in 3 weeks.  ? ? ?SIGNATURES/CONFIDENTIALITY: ?You and/or your care partner have signed paperwork which will be entered into your electronic medical record.  These signatures attest to the fact that that the information above on your After Visit Summary has been reviewed and is understood.  Full responsibility of the confidentiality of this discharge information lies with you and/or your care-partner.  ? ? ?

## 2021-11-16 NOTE — Progress Notes (Signed)
Called to room to assist during endoscopic procedure.  Patient ID and intended procedure confirmed with present staff. Received instructions for my participation in the procedure from the performing physician.  

## 2021-11-16 NOTE — Progress Notes (Signed)
? ? ?Chief Complaint: FU reflux. ? ?Referring Provider:  Marden Noble, MD    ? ? ?ASSESSMENT AND PLAN;  ? ?#1.  GERD with small HH with occ eso dysphagia ? ? ?-EGD with dil  ?-Continue protonix ? ?For EGD today ?HPI:   ? ?Laura Le is a 66 y.o. female  ? ?For follow-up visit. ? ?Still having breakthrough symptoms despite Protonix 40 mg p.o. twice daily.  She had been on Dexilant previously with good results.  She would like to switch back to Dexilant. ? ?Has been having occasional dysphagia mostly to solids-upper chest, not bad, has to wash with liquids.  Mostly happens with chicken. ? ?Most recently she has gained 10 pounds as detailed below. ? ?No odynophagia.  She denies having any diarrhea or constipation previously. ? ?She ate out few days ago at [facility].  Had "GI bug"- N/V/diarrhea which is better now. ? ? ?Wt Readings from Last 3 Encounters:  ?11/16/21 165 lb (74.8 kg)  ?10/28/21 165 lb 6.4 oz (75 kg)  ?07/22/21 172 lb 12.8 oz (78.4 kg)  ? ? ? ?Past GI procedures: ?-EGD 09/2016: 2 cm HH, mild gastritis. CLO neg ?-Colonoscopy 09/2016: mod sig div, otherwise normal.  Repeat in 10 years.  Earlier, if with any new problems. ?-[place] 02/2014: Mild hepatic steatosis. ? ?Mudlogger. ?Past Medical History:  ?Diagnosis Date  ? Anxiety   ? Clotting disorder (HCC)   ? Depression   ? situational with death of mom 10 yeras ago  ? Factor 5 Leiden mutation, heterozygous (HCC) 11/07/2017  ? Dx at time of RLE DVT approx 2014  ? GERD (gastroesophageal reflux disease) 11/07/2017  ? HTN (hypertension), benign 11/07/2017  ? on  meds  ? Lower leg DVT (deep venous thromboembolism), chronic, right (HCC) 11/07/2017  ? Occurred while on premarin approx 2014  ? PONV (postoperative nausea and vomiting)   ? general anesthesia  ? Post-operative nausea and vomiting   ? Urticaria   ? Uterine prolapse   ? ? ?Past Surgical History:  ?Procedure Laterality Date  ? COLONOSCOPY  09/30/2016  ? Moderate predominantl sigmoid  diverticulosis. Otherwise, normal colonoscopy.  ? COLONOSCOPY    ? ESOPHAGOGASTRODUODENOSCOPY  09/30/2016  ? Small hiatal hernia. Minimal gastritis.  ? eye lid lift Bilateral   ? HERNIA REPAIR    ? tumor  removed from breast    ? benign  ? VAGINAL HYSTERECTOMY    ? ? ?Family History  ?Problem Relation Age of Onset  ? Heart disease Mother   ? Bladder Cancer Father   ? Parkinson's disease Father   ? Colon cancer Neg Hx   ? Esophageal cancer Neg Hx   ? Stomach cancer Neg Hx   ? Rectal cancer Neg Hx   ? Colon polyps Neg Hx   ? ? ?Social History  ? ?Tobacco Use  ? Smoking status: Never  ? Smokeless tobacco: Never  ?Vaping Use  ? Vaping Use: Never used  ?Substance Use Topics  ? Alcohol use: Yes  ?  Alcohol/week: 0.0 standard drinks  ? Drug use: Never  ? ? ?Current Outpatient Medications  ?Medication Sig Dispense Refill  ? Cholecalciferol (VITAMIN D3 PO) Take by mouth.    ? escitalopram (LEXAPRO) 10 MG tablet Take by mouth.    ? famotidine (PEPCID) 20 MG tablet 1 tablet    ? famotidine (PEPCID) 20 MG tablet Take 1 tablet (20 mg total) by mouth 2 (two) times daily. 60 tablet 5  ? fexofenadine (ALLEGRA)  180 MG tablet Take 1 tablet by mouth every morning.    ? losartan (COZAAR) 25 MG tablet 1 tablet daily.    ? Magnesium 200 MG TABS 1 tablet with a meal    ? pantoprazole (PROTONIX) 40 MG tablet TAKE 1 TABLET BY MOUTH EVERY DAY 90 tablet 2  ? Probiotic Product (ALIGN) 4 MG CAPS Take by mouth.    ? aspirin EC 81 MG tablet Take 81 mg by mouth daily. Swallow whole.    ? Turmeric 500 MG CAPS See admin instructions.    ? valACYclovir (VALTREX) 500 MG tablet Take 500 mg by mouth as directed.    ? ?Current Facility-Administered Medications  ?Medication Dose Route Frequency Provider Last Rate Last Admin  ? 0.9 %  sodium chloride infusion  500 mL Intravenous Once Lynann Bologna, MD      ? ? ?No Known Allergies ? ?Review of Systems:  ?neg ? ?  ? ?Physical Exam:   ? ?BP 137/73   Pulse 70   Temp (!) 97.3 ?F (36.3 ?C) (Temporal)   Ht 5'  1" (1.549 m)   Wt 165 lb (74.8 kg)   LMP  (LMP Unknown)   SpO2 100%   BMI 31.18 kg/m?  ?Wt Readings from Last 3 Encounters:  ?11/16/21 165 lb (74.8 kg)  ?10/28/21 165 lb 6.4 oz (75 kg)  ?07/22/21 172 lb 12.8 oz (78.4 kg)  ? ?Gen: awake, alert, NAD ?HEENT: anicteric, no pallor ?CV: RRR, no mrg ?Pulm: CTA b/l ?Abd: soft, NT/ND, +BS throughout ?Ext: no c/c/e ?Neuro: nonfocal ? ?[name], MD 11/16/2021, 9:39 AM ? ?Cc: Marden Noble, MD ? ? ?

## 2021-11-16 NOTE — Telephone Encounter (Signed)
-----   Message from Jackquline Denmark, MD sent at 11/16/2021 10:42 AM EDT ----- ?Regarding: Husband needs colonoscopy ?Pt's husband is pt of mine from [employer] ?Needs colon for screnning ? ?Errington,Dale  ? ?Can you please arrange it through previsit ? ?RG ? ?

## 2021-11-16 NOTE — Progress Notes (Signed)
VS completed by DT.    Medical history reviewed and updated.  

## 2021-11-18 ENCOUNTER — Telehealth: Payer: Self-pay

## 2021-11-18 NOTE — Telephone Encounter (Signed)
?  Follow up Call- ? ? ?  11/16/2021  ?  9:18 AM  ?Call back number  ?Post procedure Call Back phone  # [phone]  ?Permission to leave phone message Yes  ?  ? ?Patient questions: ? ?Do you have a fever, pain , or abdominal swelling? No. ?Pain Score  0 * ? ?Have you tolerated food without any problems? Yes.   ? ?Have you been able to return to your normal activities? Yes.   ? ?Do you have any questions about your discharge instructions: ?Diet   No. ?Medications  No. ?Follow up visit  No. ? ?Do you have questions or concerns about your Care? No. ? ?Actions: ?* If pain score is 4 or above: ?No action needed, pain <4. ? ? ?

## 2021-11-22 ENCOUNTER — Encounter: Payer: Self-pay | Admitting: Gastroenterology

## 2021-12-17 ENCOUNTER — Telehealth: Payer: Self-pay | Admitting: *Deleted

## 2021-12-17 NOTE — Telephone Encounter (Signed)
Patient called regarding starting Xolair somehow I didn't receive note tor missed it to start patient.  She is going to be buy and bill and scheduled to start therapy on Mon 5/15 ?

## 2021-12-18 DIAGNOSIS — L501 Idiopathic urticaria: Secondary | ICD-10-CM | POA: Diagnosis not present

## 2021-12-21 ENCOUNTER — Ambulatory Visit (INDEPENDENT_AMBULATORY_CARE_PROVIDER_SITE_OTHER): Payer: Medicare Other | Admitting: *Deleted

## 2021-12-21 DIAGNOSIS — L501 Idiopathic urticaria: Secondary | ICD-10-CM | POA: Diagnosis not present

## 2021-12-21 MED ORDER — EPINEPHRINE 0.3 MG/0.3ML IJ SOAJ: INTRAMUSCULAR | 3 refills | Status: AC

## 2021-12-21 MED ORDER — OMALIZUMAB 150 MG/ML ~~LOC~~ SOSY
300.0000 mg | PREFILLED_SYRINGE | SUBCUTANEOUS | Status: DC
  Administered 2021-12-21 – 2022-07-06 (×7): 300 mg via SUBCUTANEOUS

## 2022-01-18 ENCOUNTER — Ambulatory Visit: Payer: Medicare Other

## 2022-01-19 ENCOUNTER — Ambulatory Visit: Payer: Medicare Other

## 2022-01-19 DIAGNOSIS — L501 Idiopathic urticaria: Secondary | ICD-10-CM | POA: Diagnosis not present

## 2022-01-20 ENCOUNTER — Ambulatory Visit (INDEPENDENT_AMBULATORY_CARE_PROVIDER_SITE_OTHER): Payer: Medicare Other | Admitting: *Deleted

## 2022-01-20 DIAGNOSIS — L501 Idiopathic urticaria: Secondary | ICD-10-CM | POA: Diagnosis not present

## 2022-02-01 ENCOUNTER — Other Ambulatory Visit: Payer: Self-pay | Admitting: Internal Medicine

## 2022-02-01 ENCOUNTER — Ambulatory Visit
Admission: RE | Admit: 2022-02-01 | Discharge: 2022-02-01 | Disposition: A | Discharge status: Home / Self Care | Payer: Medicare Other | Source: Ambulatory Visit | Attending: Internal Medicine | Admitting: Internal Medicine

## 2022-02-01 DIAGNOSIS — R221 Localized swelling, mass and lump, neck: Secondary | ICD-10-CM

## 2022-02-10 ENCOUNTER — Other Ambulatory Visit: Payer: Self-pay | Admitting: Gastroenterology

## 2022-02-17 ENCOUNTER — Ambulatory Visit: Payer: Medicare Other

## 2022-02-22 ENCOUNTER — Ambulatory Visit: Payer: Medicare Other

## 2022-02-22 DIAGNOSIS — L501 Idiopathic urticaria: Secondary | ICD-10-CM | POA: Diagnosis not present

## 2022-02-23 ENCOUNTER — Ambulatory Visit (INDEPENDENT_AMBULATORY_CARE_PROVIDER_SITE_OTHER): Payer: Medicare Other | Admitting: *Deleted

## 2022-02-23 DIAGNOSIS — L501 Idiopathic urticaria: Secondary | ICD-10-CM | POA: Diagnosis not present

## 2022-03-23 ENCOUNTER — Ambulatory Visit: Payer: Medicare Other

## 2022-03-26 DIAGNOSIS — L501 Idiopathic urticaria: Secondary | ICD-10-CM

## 2022-03-28 ENCOUNTER — Other Ambulatory Visit: Payer: Self-pay | Admitting: Allergy

## 2022-03-29 ENCOUNTER — Ambulatory Visit (INDEPENDENT_AMBULATORY_CARE_PROVIDER_SITE_OTHER): Payer: Medicare Other | Admitting: *Deleted

## 2022-03-29 DIAGNOSIS — L501 Idiopathic urticaria: Secondary | ICD-10-CM | POA: Diagnosis not present

## 2022-04-21 ENCOUNTER — Other Ambulatory Visit: Payer: Self-pay | Admitting: Allergy

## 2022-04-23 DIAGNOSIS — L501 Idiopathic urticaria: Secondary | ICD-10-CM | POA: Diagnosis not present

## 2022-04-26 ENCOUNTER — Ambulatory Visit (INDEPENDENT_AMBULATORY_CARE_PROVIDER_SITE_OTHER): Payer: Medicare Other

## 2022-04-26 DIAGNOSIS — L501 Idiopathic urticaria: Secondary | ICD-10-CM

## 2022-05-18 ENCOUNTER — Other Ambulatory Visit: Payer: Self-pay | Admitting: Allergy

## 2022-05-26 DIAGNOSIS — L501 Idiopathic urticaria: Secondary | ICD-10-CM

## 2022-05-27 ENCOUNTER — Ambulatory Visit (INDEPENDENT_AMBULATORY_CARE_PROVIDER_SITE_OTHER): Payer: Medicare Other | Admitting: *Deleted

## 2022-05-27 DIAGNOSIS — L501 Idiopathic urticaria: Secondary | ICD-10-CM

## 2022-06-24 ENCOUNTER — Ambulatory Visit: Payer: Medicare Other

## 2022-07-05 DIAGNOSIS — L501 Idiopathic urticaria: Secondary | ICD-10-CM | POA: Diagnosis not present

## 2022-07-06 ENCOUNTER — Ambulatory Visit: Payer: Medicare Other

## 2022-07-06 ENCOUNTER — Ambulatory Visit (INDEPENDENT_AMBULATORY_CARE_PROVIDER_SITE_OTHER): Payer: Medicare Other | Admitting: Allergy

## 2022-07-06 ENCOUNTER — Encounter: Payer: Self-pay | Admitting: Allergy

## 2022-07-06 VITALS — BP 138/82 | HR 72 | Resp 18

## 2022-07-06 DIAGNOSIS — L501 Idiopathic urticaria: Secondary | ICD-10-CM

## 2022-07-06 NOTE — Patient Instructions (Addendum)
Chronic urticaria  - doing well at this time on Xolair.  Will start to wean Xolair for next visit and see how you continue to do with hive control.  If at anytime during the wean you start to have any itching or hives let us know.   - etiology of hives is spontaneous in nature but you do have a immune driven component  - your labwork was reassuring  - for hive management recommend the following high-dose antihistamine regimen: Allegra 180mg  1 tab once a day (max dosing is 4 tabs/day) with Famotidine 20mg  1 tab once a day.     Can increase both to twice a day dosing if needed for prevention of hives (ie with illnesses or vaccines or stressors on the body)  - Continue Xolair monthly injections for control of chronic spontaneous hives.   Next dose will decrease to 150mg  (half dose which is 1 injection)   Follow-up in 6 months or sooner if needed

## 2022-07-06 NOTE — Progress Notes (Signed)
Follow-up Note  RE: Victorious Cosio MRN: [id number] DOB: [birth year] Date of Office Visit: 07/06/2022   History of present illness: Laura Le is a 66 y.o. female presenting today for follow-up of chronic urticaria.  She was last seen in the office on 10/28/2021 by myself.  She has had 2 surgeries and an respiratory illness since last visit.  She is on monthly Xolair that was started after her last visit.  She has not had any further episodes of hives since starting on Xolair.  She is down to Allegra 1 tab daily and Famotidine 1 tab daily.  The famotidine does help with her reflux control.  Allegra does help with her sinus symptoms and decreasing sinus infections. She is tolerating Xolair well without large local or systemic reactions.   Review of systems: Review of Systems  Constitutional: Negative.   HENT: Negative.    Eyes: Negative.   Respiratory: Negative.    Cardiovascular: Negative.   Gastrointestinal: Negative.   Musculoskeletal: Negative.   Skin: Negative.   Allergic/Immunologic: Negative.   Neurological: Negative.      All other systems negative unless noted above in HPI  Past medical/social/surgical/family history have been reviewed and are unchanged unless specifically indicated below.  No changes  Medication List: Current Outpatient Medications  Medication Sig Dispense Refill   aspirin EC 81 MG tablet Take 81 mg by mouth daily. Swallow whole.     Cholecalciferol (VITAMIN D3 PO) Take by mouth.     EPINEPHrine 0.3 mg/0.3 mL IJ SOAJ injection Use as directed for life threatening allergic reactions 2 each 3   escitalopram (LEXAPRO) 10 MG tablet Take by mouth.     famotidine (PEPCID) 20 MG tablet TAKE 1 TABLET BY MOUTH TWICE A DAY 180 tablet 1   fexofenadine (ALLEGRA) 180 MG tablet Take 1 tablet by mouth every morning.     losartan (COZAAR) 25 MG tablet 1 tablet daily.     pantoprazole (PROTONIX) 40 MG tablet TAKE 1 TABLET BY MOUTH EVERY DAY 90 tablet 2   Probiotic  Product (ALIGN) 4 MG CAPS Take by mouth.     valACYclovir (VALTREX) 500 MG tablet Take 500 mg by mouth as directed.     Current Facility-Administered Medications  Medication Dose Route Frequency Provider Last Rate Last Admin   omalizumab Geoffry Paradise) prefilled syringe 300 mg  300 mg Subcutaneous Q28 days Kozlow, Alvira Philips, MD   300 mg at 07/06/22 1052     Known medication allergies: No Known Allergies   Physical examination: Blood pressure 138/82, pulse 72, resp. rate 18, SpO2 98 %.  General: Alert, interactive, in no acute distress. HEENT: PERRLA, TMs pearly gray, turbinates non-edematous without discharge, post-pharynx non erythematous. Neck: Supple without lymphadenopathy. Lungs: Clear to auscultation without wheezing, rhonchi or rales. {no increased work of breathing. CV: Normal S1, S2 without murmurs. Abdomen: Nondistended, nontender. Skin: Warm and dry, without lesions or rashes. Extremities:  No clubbing, cyanosis or edema. Neuro:   Grossly intact.  Diagnositics/Labs: Xolair injections given in office today  Assessment and plan:   Chronic urticaria  - doing well at this time on Xolair.  Will start to wean Xolair for next visit and see how you continue to do with hive control.  If at anytime during the wean you start to have any itching or hives let us know.   - etiology of hives is spontaneous in nature but you do have a immune driven component  - your labwork was reassuring  - for  hive management recommend the following high-dose antihistamine regimen: Allegra 180mg  1 tab once a day (max dosing is 4 tabs/day) with Famotidine 20mg  1 tab once a day.     Can increase both to twice a day dosing if needed for prevention of hives (ie with illnesses or vaccines or stressors on the body)  - Continue Xolair monthly injections for control of chronic spontaneous hives.   Next dose will decrease to 150mg  (half dose which is 1 injection)   Follow-up in 6 months or sooner if needed   I  appreciate the opportunity to take part in [name]'s care. Please do not hesitate to contact me with questions.  Sincerely,   , MD Allergy/Immunology [facility]

## 2022-08-04 ENCOUNTER — Ambulatory Visit: Payer: Medicare Other

## 2022-08-05 ENCOUNTER — Ambulatory Visit (INDEPENDENT_AMBULATORY_CARE_PROVIDER_SITE_OTHER): Payer: Medicare Other | Admitting: *Deleted

## 2022-08-05 DIAGNOSIS — L501 Idiopathic urticaria: Secondary | ICD-10-CM | POA: Diagnosis not present

## 2022-08-05 MED ORDER — OMALIZUMAB 150 MG/ML ~~LOC~~ SOSY
150.0000 mg | PREFILLED_SYRINGE | SUBCUTANEOUS | Status: AC
  Administered 2022-08-05 – 2024-08-14 (×24): 150 mg via SUBCUTANEOUS

## 2022-09-02 ENCOUNTER — Ambulatory Visit (INDEPENDENT_AMBULATORY_CARE_PROVIDER_SITE_OTHER): Payer: Medicare Other | Admitting: *Deleted

## 2022-09-02 DIAGNOSIS — L501 Idiopathic urticaria: Secondary | ICD-10-CM | POA: Diagnosis not present

## 2022-09-30 ENCOUNTER — Ambulatory Visit (INDEPENDENT_AMBULATORY_CARE_PROVIDER_SITE_OTHER): Payer: Medicare Other | Admitting: *Deleted

## 2022-09-30 DIAGNOSIS — L501 Idiopathic urticaria: Secondary | ICD-10-CM

## 2022-11-02 ENCOUNTER — Ambulatory Visit (INDEPENDENT_AMBULATORY_CARE_PROVIDER_SITE_OTHER): Payer: Medicare Other

## 2022-11-02 DIAGNOSIS — L501 Idiopathic urticaria: Secondary | ICD-10-CM | POA: Diagnosis not present

## 2022-11-14 ENCOUNTER — Other Ambulatory Visit: Payer: Self-pay | Admitting: Allergy

## 2022-11-14 ENCOUNTER — Other Ambulatory Visit: Payer: Self-pay | Admitting: Gastroenterology

## 2022-11-26 ENCOUNTER — Telehealth: Payer: Self-pay | Admitting: Allergy

## 2022-11-26 NOTE — Telephone Encounter (Signed)
Laura Le called in and states she has a sinus infection.  [name] states she is leaving for [place] on Monday and would like to know if Dr. Delorse Lek would call something in for this.  She uses [facility] in [place].

## 2022-11-29 ENCOUNTER — Ambulatory Visit (INDEPENDENT_AMBULATORY_CARE_PROVIDER_SITE_OTHER): Payer: Medicare Other | Admitting: Allergy and Immunology

## 2022-11-29 ENCOUNTER — Encounter: Payer: Self-pay | Admitting: Allergy and Immunology

## 2022-11-29 ENCOUNTER — Ambulatory Visit: Payer: Medicare Other

## 2022-11-29 VITALS — BP 126/72 | HR 77 | Resp 16

## 2022-11-29 DIAGNOSIS — J3089 Other allergic rhinitis: Secondary | ICD-10-CM | POA: Diagnosis not present

## 2022-11-29 DIAGNOSIS — J988 Other specified respiratory disorders: Secondary | ICD-10-CM

## 2022-11-29 DIAGNOSIS — B9789 Other viral agents as the cause of diseases classified elsewhere: Secondary | ICD-10-CM | POA: Diagnosis not present

## 2022-11-29 DIAGNOSIS — L501 Idiopathic urticaria: Secondary | ICD-10-CM | POA: Diagnosis not present

## 2022-11-29 NOTE — Telephone Encounter (Signed)
Per provider:  If she has had more than a week of symptoms that are not improving that include any of the following (increased congestion, sinus pressure or pain, teeth pain, fever, night sweats or chills, chest congestion) then she can get Augmentin  1 tab twice a day.  May also benefit from Mucinex OTC.    Called patient - DPR verified - LMOVM regarding provider notation above.

## 2022-11-29 NOTE — Patient Instructions (Addendum)
  1. Treat inflammation of airway:   A. Prednisone 10 mg - 2 tablets administered in clinic today  B. Prednisone 10 mg - 1 tablet 1 time per day for 10 days  2. Continue Nasacort, Allegra, nasal saline  3. Continue omalizumab injections  4. Further treatment??? Antibiotics???

## 2022-11-29 NOTE — Progress Notes (Unsigned)
[place] - [place] - [place] - [place] - [place]   Follow-up Note  Referring Provider: Marden Noble, MD Primary Provider: Marden Noble, MD Date of Office Visit: 11/29/2022  Subjective:   Laura Le (DOB: May 31, 1956) is a 67 y.o. female who [facility]22 in re-evaluation of the following:  HPI: Laura Le presents to this clinic in evaluation of acute respiratory tract infection.  She is seen in this clinic for chronic urticaria treated with omalizumab.  I have never seen her in this clinic and her last visit with Dr. Delorse Lek was 06 July 2022.  For the past 5 days she has had acute onset of headache and facial pressure and nasal congestion and some intermittent anosmia without any ugly nasal discharge or fever.  She apparently did a COVID swab which was negative.  She might be a little bit better today.  She uses Nasacort and Allegra on a consistent basis.  Her atopic respiratory disease is actually under a lot better control since she has been using Xolair for her chronic urticaria.  While using Xolair for chronic urticaria she really has no significant problems with her urticaria and it is entirely under control.  Allergies as of 11/29/2022   No Known Allergies      Medication List    Align 4 MG Caps Take by mouth.   aspirin EC 81 MG tablet Take 81 mg by mouth daily. Swallow whole.   EPINEPHrine 0.3 mg/0.3 mL Soaj injection Commonly known as: EPI-PEN Use as directed for life threatening allergic reactions   escitalopram 10 MG tablet Commonly known as: LEXAPRO Take by mouth.   famotidine 20 MG tablet Commonly known as: PEPCID TAKE 1 TABLET BY MOUTH TWICE A DAY   fexofenadine 180 MG tablet Commonly known as: ALLEGRA Take 1 tablet by mouth every morning.   losartan 25 MG tablet Commonly known as: COZAAR 1 tablet daily.   pantoprazole 40 MG tablet Commonly known as: PROTONIX Take 1 tablet (40 mg total) by  mouth daily. Please call [phone] to schedule an office visit for more refills   valACYclovir 500 MG tablet Commonly known as: VALTREX Take 500 mg by mouth as directed.   VITAMIN D3 PO Take by mouth.        Past Medical History:  Diagnosis Date   Anxiety    Clotting disorder    Depression    situational with death of mom 54 yeras ago   Factor 5 Leiden mutation, heterozygous 11/07/2017   Dx at time of RLE DVT approx 2014   GERD (gastroesophageal reflux disease) 11/07/2017   HTN (hypertension), benign 11/07/2017   on  meds   Lower leg DVT (deep venous thromboembolism), chronic, right 11/07/2017   Occurred while on premarin approx 2014   PONV (postoperative nausea and vomiting)    general anesthesia   Post-operative nausea and vomiting    Urticaria    Uterine prolapse     Past Surgical History:  Procedure Laterality Date   COLONOSCOPY  09/30/2016   Moderate predominantl sigmoid diverticulosis. Otherwise, normal colonoscopy.   COLONOSCOPY     ESOPHAGOGASTRODUODENOSCOPY  09/30/2016   Small hiatal hernia. Minimal gastritis.   eye lid lift Bilateral    HERNIA REPAIR     tumor  removed from breast     benign   VAGINAL HYSTERECTOMY      Review of systems negative except as noted in HPI / PMHx or noted below:  Review of Systems  Constitutional: Negative.   HENT: Negative.    Eyes: Negative.   Respiratory: Negative.    Cardiovascular: Negative.   Gastrointestinal: Negative.   Genitourinary: Negative.   Musculoskeletal: Negative.   Skin: Negative.   Neurological: Negative.   Endo/Heme/Allergies: Negative.   Psychiatric/Behavioral: Negative.       Objective:   Vitals:   11/29/22 1057  BP: 126/72  Pulse: 77  Resp: 16  SpO2: 97%          Physical Exam Constitutional:      Appearance: She is not diaphoretic.     Comments: Nasal voice  HENT:     Head: Normocephalic.     Right Ear: Tympanic membrane, ear canal and external ear normal.     Left  Ear: Tympanic membrane, ear canal and external ear normal.     Nose: Mucosal edema (Erythematous) present. No rhinorrhea.     Mouth/Throat:     Pharynx: Uvula midline. No oropharyngeal exudate.  Eyes:     Conjunctiva/sclera: Conjunctivae normal.  Neck:     Thyroid: No thyromegaly.     Trachea: Trachea normal. No tracheal tenderness or tracheal deviation.  Cardiovascular:     Rate and Rhythm: Normal rate and regular rhythm.     Heart sounds: Normal heart sounds, S1 normal and S2 normal. No murmur heard. Pulmonary:     Effort: No respiratory distress.     Breath sounds: Normal breath sounds. No stridor. No wheezing or rales.  Lymphadenopathy:     Head:     Right side of head: No tonsillar adenopathy.     Left side of head: No tonsillar adenopathy.     Cervical: No cervical adenopathy.  Skin:    Findings: No erythema or rash.     Nails: There is no clubbing.  Neurological:     Mental Status: She is alert.     Diagnostics: none   Assessment and Plan:   1. Idiopathic urticaria   2. Viral respiratory infection   3. Perennial allergic rhinitis    1. Treat inflammation of airway:   A. Prednisone 10 mg - 2 tablets administered in clinic today  B. Prednisone 10 mg - 1 tablet 1 time per day for 10 days  2. Continue Nasacort, Allegra, nasal saline  3. Continue omalizumab injections  4. Further treatment??? Antibiotics???  It appears that Laura Le has developed a viral respiratory tract infection and we will treat her with some anti-inflammatory medications to deal with the significant inflammation of her airway that is coming out with this infection and hold off on any antibiotics at this point in time.  Overall she has done very well with her use of omalizumab to control her chronic urticaria and also to address her atopic respiratory disease.  She can follow-up in this clinic with Dr. Delorse Lek.  Laurette Schimke, MD Allergy / Immunology [facility]

## 2022-11-30 ENCOUNTER — Encounter: Payer: Self-pay | Admitting: Allergy and Immunology

## 2022-11-30 ENCOUNTER — Ambulatory Visit: Payer: Medicare Other

## 2022-12-01 NOTE — Telephone Encounter (Signed)
Called and left a message for patient to call our office to review the note per Dr. Delorse Lek.

## 2022-12-07 ENCOUNTER — Other Ambulatory Visit: Payer: Self-pay | Admitting: Allergy

## 2022-12-11 ENCOUNTER — Other Ambulatory Visit: Payer: Self-pay | Admitting: Allergy

## 2022-12-27 ENCOUNTER — Ambulatory Visit: Payer: Medicare Other

## 2023-01-04 ENCOUNTER — Ambulatory Visit (INDEPENDENT_AMBULATORY_CARE_PROVIDER_SITE_OTHER): Payer: Medicare Other | Admitting: *Deleted

## 2023-01-04 DIAGNOSIS — L501 Idiopathic urticaria: Secondary | ICD-10-CM

## 2023-02-01 ENCOUNTER — Ambulatory Visit: Payer: Medicare Other

## 2023-02-03 ENCOUNTER — Ambulatory Visit (INDEPENDENT_AMBULATORY_CARE_PROVIDER_SITE_OTHER): Payer: Medicare Other | Admitting: *Deleted

## 2023-02-03 DIAGNOSIS — L501 Idiopathic urticaria: Secondary | ICD-10-CM | POA: Diagnosis not present

## 2023-03-02 ENCOUNTER — Ambulatory Visit (INDEPENDENT_AMBULATORY_CARE_PROVIDER_SITE_OTHER): Payer: Medicare Other | Admitting: *Deleted

## 2023-03-02 DIAGNOSIS — L501 Idiopathic urticaria: Secondary | ICD-10-CM

## 2023-03-03 ENCOUNTER — Ambulatory Visit: Payer: Medicare Other

## 2023-03-28 ENCOUNTER — Ambulatory Visit (INDEPENDENT_AMBULATORY_CARE_PROVIDER_SITE_OTHER): Payer: Medicare Other | Admitting: *Deleted

## 2023-03-28 DIAGNOSIS — L501 Idiopathic urticaria: Secondary | ICD-10-CM

## 2023-03-30 ENCOUNTER — Ambulatory Visit: Payer: Medicare Other

## 2023-04-25 ENCOUNTER — Ambulatory Visit: Payer: Medicare Other

## 2023-04-26 ENCOUNTER — Ambulatory Visit (INDEPENDENT_AMBULATORY_CARE_PROVIDER_SITE_OTHER): Payer: Medicare Other | Admitting: *Deleted

## 2023-04-26 DIAGNOSIS — L501 Idiopathic urticaria: Secondary | ICD-10-CM

## 2023-05-24 ENCOUNTER — Ambulatory Visit: Payer: Medicare Other

## 2023-05-24 DIAGNOSIS — L501 Idiopathic urticaria: Secondary | ICD-10-CM

## 2023-06-21 ENCOUNTER — Ambulatory Visit (INDEPENDENT_AMBULATORY_CARE_PROVIDER_SITE_OTHER): Payer: Medicare Other | Admitting: *Deleted

## 2023-06-21 DIAGNOSIS — L501 Idiopathic urticaria: Secondary | ICD-10-CM | POA: Diagnosis not present

## 2023-07-19 ENCOUNTER — Ambulatory Visit: Payer: Medicare Other

## 2023-07-25 ENCOUNTER — Ambulatory Visit: Payer: Medicare Other

## 2023-07-26 ENCOUNTER — Ambulatory Visit (INDEPENDENT_AMBULATORY_CARE_PROVIDER_SITE_OTHER): Payer: Medicare Other

## 2023-07-26 DIAGNOSIS — L501 Idiopathic urticaria: Secondary | ICD-10-CM

## 2023-08-23 ENCOUNTER — Ambulatory Visit: Payer: Medicare Other

## 2023-08-25 ENCOUNTER — Ambulatory Visit (INDEPENDENT_AMBULATORY_CARE_PROVIDER_SITE_OTHER): Payer: Medicare Other | Admitting: *Deleted

## 2023-08-25 DIAGNOSIS — L501 Idiopathic urticaria: Secondary | ICD-10-CM

## 2023-09-22 ENCOUNTER — Ambulatory Visit: Payer: Medicare Other

## 2023-09-27 ENCOUNTER — Ambulatory Visit (INDEPENDENT_AMBULATORY_CARE_PROVIDER_SITE_OTHER): Payer: Medicare Other | Admitting: *Deleted

## 2023-09-27 DIAGNOSIS — L501 Idiopathic urticaria: Secondary | ICD-10-CM | POA: Diagnosis not present

## 2023-10-25 ENCOUNTER — Ambulatory Visit: Payer: Medicare Other

## 2023-10-25 ENCOUNTER — Telehealth: Payer: Self-pay

## 2023-10-25 DIAGNOSIS — L501 Idiopathic urticaria: Secondary | ICD-10-CM | POA: Diagnosis not present

## 2023-10-25 NOTE — Telephone Encounter (Signed)
 Patient informed and agreed with plan. Flowsheet was updated

## 2023-10-25 NOTE — Telephone Encounter (Signed)
 Patient came in for her Xolair injection. She is on Xolair 150 B&B every 28 days.   She was wondering if it would be ok for her to change her schedule to every 6 weeks or longer.   She has not had any issues and thinks she might be able to go longer without Xolair.   She states if she needs to make a follow up she will. She wanted me to ask first.    Please advice. Thanks

## 2023-11-22 ENCOUNTER — Ambulatory Visit

## 2023-12-06 ENCOUNTER — Ambulatory Visit

## 2023-12-22 ENCOUNTER — Ambulatory Visit (INDEPENDENT_AMBULATORY_CARE_PROVIDER_SITE_OTHER): Admitting: *Deleted

## 2023-12-22 DIAGNOSIS — L501 Idiopathic urticaria: Secondary | ICD-10-CM | POA: Diagnosis not present

## 2024-01-31 ENCOUNTER — Ambulatory Visit

## 2024-02-02 ENCOUNTER — Ambulatory Visit

## 2024-02-06 ENCOUNTER — Ambulatory Visit (INDEPENDENT_AMBULATORY_CARE_PROVIDER_SITE_OTHER): Admitting: *Deleted

## 2024-02-06 DIAGNOSIS — L501 Idiopathic urticaria: Secondary | ICD-10-CM

## 2024-03-05 ENCOUNTER — Ambulatory Visit

## 2024-03-19 ENCOUNTER — Ambulatory Visit

## 2024-04-02 ENCOUNTER — Ambulatory Visit

## 2024-04-03 ENCOUNTER — Ambulatory Visit (INDEPENDENT_AMBULATORY_CARE_PROVIDER_SITE_OTHER)

## 2024-04-03 DIAGNOSIS — L501 Idiopathic urticaria: Secondary | ICD-10-CM

## 2024-04-05 ENCOUNTER — Ambulatory Visit (INDEPENDENT_AMBULATORY_CARE_PROVIDER_SITE_OTHER): Admitting: Allergy and Immunology

## 2024-04-05 ENCOUNTER — Ambulatory Visit: Admitting: Allergy and Immunology

## 2024-04-05 ENCOUNTER — Encounter: Payer: Self-pay | Admitting: Allergy and Immunology

## 2024-04-05 VITALS — BP 126/72 | HR 86 | Resp 16

## 2024-04-05 DIAGNOSIS — L501 Idiopathic urticaria: Secondary | ICD-10-CM | POA: Diagnosis not present

## 2024-04-05 MED ORDER — METHYLPREDNISOLONE ACETATE 80 MG/ML IJ SUSP
80.0000 mg | Freq: Once | INTRAMUSCULAR | Status: AC
  Administered 2024-04-05: 80 mg via INTRAMUSCULAR

## 2024-04-05 NOTE — Progress Notes (Signed)
 [place] - [place] - [place] - [place] - [place]   Follow-up Note  Referring Provider: No ref. provider found Primary Provider: Delice Charleston, MD (Inactive) Date of Office Visit: 04/05/2024  Subjective:   Laura Le (DOB: 10-Oct-1955) is a 68 y.o. female who [facility] on 04/[facility]/2024 in re-evaluation of the following:  HPI: Laura Le presents to this clinic in evaluation of chronic urticaria.  She was last seen in this clinic on 29 November 2022.    She was doing pretty well while using omalizumab  injections regarding control of her chronic urticaria.  She has been slowly stretching out the interval of use and she was out to every 8 weeks.  She contracted an episode of shingles although what she really had was some red itchy hive-like lesions several areas on her body but she was given Valtrex and completed that agent about 3 weeks ago.  Over the course of the past 2 weeks and especially this past week she has once again developed diffuse pruritus and hives.  She has no other associated systemic or constitutional symptoms.  Allergies as of 04/05/2024   No Known Allergies      Medication List    Align 4 MG Caps Take by mouth.   aspirin EC 81 MG tablet Take 81 mg by mouth daily. Swallow whole.   EPINEPHrine  0.3 mg/0.3 mL Soaj injection Commonly known as: EPI-PEN Use as directed for life threatening allergic reactions   escitalopram 10 MG tablet Commonly known as: LEXAPRO Take by mouth.   famotidine  20 MG tablet Commonly known as: PEPCID  TAKE 1 TABLET BY MOUTH TWICE A DAY   fexofenadine 180 MG tablet Commonly known as: ALLEGRA Take 1 tablet by mouth every morning.   losartan 25 MG tablet Commonly known as: COZAAR 1 tablet daily.   pantoprazole  40 MG tablet Commonly known as: PROTONIX  Take 1 tablet (40 mg total) by mouth daily. Please call [phone] to schedule an office visit for more refills   valACYclovir 500 MG  tablet Commonly known as: VALTREX Take 500 mg by mouth as directed.   VITAMIN D3 PO Take by mouth.    Past Medical History:  Diagnosis Date   Anxiety    Clotting disorder [facility])    Depression    situational with death of mom 42 yeras ago   Factor 5 Leiden mutation, heterozygous (HCC) 11/07/2017   Dx at time of RLE DVT approx 2014   GERD (gastroesophageal reflux disease) 11/07/2017   HTN (hypertension), benign 11/07/2017   on  meds   Lower leg DVT (deep venous thromboembolism), chronic, right (HCC) 11/07/2017   Occurred while on premarin approx 2014   PONV (postoperative nausea and vomiting)    general anesthesia   Post-operative nausea and vomiting    Urticaria    Uterine prolapse     Past Surgical History:  Procedure Laterality Date   COLONOSCOPY  09/30/2016   Moderate predominantl sigmoid diverticulosis. Otherwise, normal colonoscopy.   COLONOSCOPY     ESOPHAGOGASTRODUODENOSCOPY  09/30/2016   Small hiatal hernia. Minimal gastritis.   eye lid lift Bilateral    HERNIA REPAIR     tumor  removed from breast     benign   VAGINAL HYSTERECTOMY      Review of systems negative except as noted in HPI / PMHx or noted below:  Review of Systems  Constitutional: Negative.   HENT: Negative.    Eyes: Negative.   Respiratory: Negative.  Cardiovascular: Negative.   Gastrointestinal: Negative.   Genitourinary: Negative.   Musculoskeletal: Negative.   Skin: Negative.   Neurological: Negative.   Endo/Heme/Allergies: Negative.   Psychiatric/Behavioral: Negative.       Objective:   Vitals:   04/05/24 1405  BP: 126/72  Pulse: 86  Resp: 16  SpO2: 97%          Physical Exam Skin:    Findings: Rash (Slightly erythematous blanching urticarial lesions forearms.) present.     Diagnostics: none  Assessment and Plan:   1. Idiopathic urticaria    1. Treat immune activation: Depomedrol 80 IM delivered in clinic today   2. Continue Nasacort, Allegra, nasal  saline  3. Continue omalizumab  injections. Restart every 4 week administration  4. Influenza = Tamiflu. Covid = Paxlovid  Angi appears to have some form of immune activation and I cannot really tell if this is secondary to the fact that she stretched out the interval of her omalizumab  administration to every 8 weeks or maybe her shingles episode that occurred 4 weeks ago activated her immune system.  I have given her systemic steroid and we will see what happens with this administration regarding this immune activation.  Did recommend that she go back to using her omalizumab  every 4 weeks at least until she determines that she is doing well  Laura Denis, MD Allergy / Immunology [facility]

## 2024-04-05 NOTE — Patient Instructions (Addendum)
  1. Treat immune activation: Depomedrol 80 IM delivered in clinic today   2. Continue Nasacort, Allegra, nasal saline  3. Continue omalizumab  injections. Restart every 4 week administration  4. Influenza = Tamiflu. Covid = Paxlovid

## 2024-04-10 ENCOUNTER — Encounter: Payer: Self-pay | Admitting: Allergy and Immunology

## 2024-05-07 ENCOUNTER — Ambulatory Visit: Admitting: Gastroenterology

## 2024-05-15 ENCOUNTER — Ambulatory Visit (INDEPENDENT_AMBULATORY_CARE_PROVIDER_SITE_OTHER)

## 2024-05-15 DIAGNOSIS — L501 Idiopathic urticaria: Secondary | ICD-10-CM | POA: Diagnosis not present

## 2024-06-12 ENCOUNTER — Ambulatory Visit

## 2024-06-18 ENCOUNTER — Ambulatory Visit (INDEPENDENT_AMBULATORY_CARE_PROVIDER_SITE_OTHER): Admitting: *Deleted

## 2024-06-18 DIAGNOSIS — L501 Idiopathic urticaria: Secondary | ICD-10-CM

## 2024-07-09 ENCOUNTER — Ambulatory Visit: Admitting: Orthopaedic Surgery

## 2024-07-09 ENCOUNTER — Other Ambulatory Visit: Payer: Self-pay

## 2024-07-09 ENCOUNTER — Encounter: Payer: Self-pay | Admitting: Orthopaedic Surgery

## 2024-07-09 DIAGNOSIS — M25561 Pain in right knee: Secondary | ICD-10-CM

## 2024-07-09 DIAGNOSIS — G8929 Other chronic pain: Secondary | ICD-10-CM

## 2024-07-09 NOTE — Progress Notes (Signed)
 The patient is a very active 68 year old I am seeing for the first time.  She had been having right knee pain off and on but is becoming weak and giving way for her.  She does participate in an exercise program and she does describe that they do a lot of deep knee bends with lunges and squats.  Even a quad strengthening machine with knee extensions is causing problems.  She denies any specific injury.  She does not have pain or wakes her up at night.  She really wants to make sure she has not injured her knee she states.  She has never had injections in that knee or any surgery on the right knee.  On examination her right knee shows neutral alignment.  There is slight hyperextension of both knees.  There is no effusion of either knee.  Right knee has full range of motion and is ligamentously stable.  Her Lachman's and McMurray's exams are negative.  There is patellofemoral crepitation.  2 views of the right knee show neutral alignment with well-maintained medial lateral compartments.  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  I believe her exercise routine is what is contributing more to her knee issues.  She should stop the squats and lunges and only go halfway down and work specifically on specific quad strengthening exercises.  I showed her how to perform straight leg raises as well without any weights.  All questions and concerns were answered and addressed.  Follow-up can be as needed unless this worsens for her.

## 2024-07-16 ENCOUNTER — Ambulatory Visit

## 2024-07-17 ENCOUNTER — Ambulatory Visit

## 2024-07-17 DIAGNOSIS — L501 Idiopathic urticaria: Secondary | ICD-10-CM

## 2024-08-13 ENCOUNTER — Ambulatory Visit

## 2024-08-14 ENCOUNTER — Ambulatory Visit

## 2024-08-14 DIAGNOSIS — L501 Idiopathic urticaria: Secondary | ICD-10-CM | POA: Diagnosis not present

## 2024-09-11 ENCOUNTER — Ambulatory Visit

## 2024-09-12 ENCOUNTER — Ambulatory Visit

## 2024-09-13 ENCOUNTER — Ambulatory Visit
